# Patient Record
Sex: Male | Born: 1995 | Race: White | Hispanic: No | Marital: Single | Smoking: Current every day smoker
Health system: Southern US, Community
[De-identification: ages and names within clinical notes are randomized; demographics above are authoritative.]

## PROBLEM LIST (undated history)

## (undated) ENCOUNTER — Emergency Department (HOSPITAL_COMMUNITY): Admission: EM | Payer: Self-pay | Source: Home / Self Care

## (undated) ENCOUNTER — Emergency Department: Admission: EM | Source: Home / Self Care

## (undated) DIAGNOSIS — F329 Major depressive disorder, single episode, unspecified: Secondary | ICD-10-CM

## (undated) DIAGNOSIS — M419 Scoliosis, unspecified: Secondary | ICD-10-CM

## (undated) DIAGNOSIS — J45909 Unspecified asthma, uncomplicated: Secondary | ICD-10-CM

## (undated) DIAGNOSIS — F32A Depression, unspecified: Secondary | ICD-10-CM

## (undated) DIAGNOSIS — E079 Disorder of thyroid, unspecified: Secondary | ICD-10-CM

---
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
## 2015-01-27 ENCOUNTER — Encounter (HOSPITAL_COMMUNITY): Payer: Self-pay | Admitting: *Deleted

## 2015-01-27 ENCOUNTER — Emergency Department (HOSPITAL_COMMUNITY)
Admission: EM | Admit: 2015-01-27 | Discharge: 2015-01-27 | Disposition: A | Discharge status: Home / Self Care | Payer: Medicaid Other | Attending: Emergency Medicine | Admitting: Emergency Medicine

## 2015-01-27 DIAGNOSIS — Z8739 Personal history of other diseases of the musculoskeletal system and connective tissue: Secondary | ICD-10-CM | POA: Insufficient documentation

## 2015-01-27 DIAGNOSIS — J45909 Unspecified asthma, uncomplicated: Secondary | ICD-10-CM | POA: Diagnosis not present

## 2015-01-27 DIAGNOSIS — Z720 Tobacco use: Secondary | ICD-10-CM | POA: Diagnosis not present

## 2015-01-27 DIAGNOSIS — R300 Dysuria: Secondary | ICD-10-CM

## 2015-01-27 DIAGNOSIS — Z880 Allergy status to penicillin: Secondary | ICD-10-CM | POA: Diagnosis not present

## 2015-01-27 DIAGNOSIS — Z79899 Other long term (current) drug therapy: Secondary | ICD-10-CM | POA: Insufficient documentation

## 2015-01-27 HISTORY — DX: Unspecified asthma, uncomplicated: J45.909

## 2015-01-27 LAB — URINALYSIS, ROUTINE W REFLEX MICROSCOPIC
Bilirubin Urine: NEGATIVE
Glucose, UA: NEGATIVE mg/dL
Hgb urine dipstick: NEGATIVE
KETONES UR: NEGATIVE mg/dL
Nitrite: NEGATIVE
Protein, ur: NEGATIVE mg/dL
Specific Gravity, Urine: 1.02 (ref 1.005–1.030)
Urobilinogen, UA: 0.2 mg/dL (ref 0.0–1.0)
pH: 6.5 (ref 5.0–8.0)

## 2015-01-27 LAB — URINE MICROSCOPIC-ADD ON

## 2015-01-27 MED ORDER — CEFTRIAXONE SODIUM 250 MG IJ SOLR
250.0000 mg | Freq: Once | INTRAMUSCULAR | Status: AC
  Administered 2015-01-27: 250 mg via INTRAMUSCULAR
  Filled 2015-01-27: qty 250

## 2015-01-27 MED ORDER — CIPROFLOXACIN HCL 500 MG PO TABS: 500.0000 mg | ORAL_TABLET | Freq: Two times a day (BID) | ORAL | Status: DC

## 2015-01-27 MED ORDER — AZITHROMYCIN 250 MG PO TABS
1000.0000 mg | ORAL_TABLET | Freq: Once | ORAL | Status: AC
  Administered 2015-01-27: 1000 mg via ORAL
  Filled 2015-01-27: qty 4

## 2015-01-27 MED ORDER — LIDOCAINE HCL (PF) 1 % IJ SOLN
INTRAMUSCULAR | Status: AC
  Administered 2015-01-27: 2 mL
  Filled 2015-01-27: qty 5

## 2015-01-27 NOTE — ED Notes (Signed)
Dysuria for 1.5 weeks, alert, NAD

## 2015-01-27 NOTE — Discharge Instructions (Signed)
You were treated tonight with Zithromax and Rocephin. Please [facility] for additional testing and for follow-up if the pain with urination is not improving. Please use Cipro daily for the next 5 days. Please take this medication with food.

## 2015-01-27 NOTE — ED Provider Notes (Signed)
CSN: [id number]     Arrival date & time 01/27/15  1910 History   First MD Initiated Contact with Patient 01/27/15 2048     Chief Complaint  Patient presents with  . s74.5      (Consider location/radiation/quality/duration/timing/severity/associated sxs/prior Treatment) Patient is a 19 y.o. male presenting with dysuria. The history is provided by the patient.  Dysuria This is a new problem. The current episode started 1 to 4 weeks ago. The problem occurs intermittently. The problem has been gradually worsening. Associated symptoms include urinary symptoms. Pertinent negatives include no abdominal pain, arthralgias, chills, fever, headaches or vomiting. Exacerbated by: urination. He has tried nothing for the symptoms. The treatment provided no relief.    Past Medical History  Diagnosis Date  . Scoliosis   . Asthma    History reviewed. No pertinent past surgical history. History reviewed. No pertinent family history. History  Substance Use Topics  . Smoking status: Current Every Day Smoker -- 0.25 packs/day    Types: Cigarettes  . Smokeless tobacco: Not on file  . Alcohol Use: Yes     Comment: socially    Review of Systems  Constitutional: Negative for fever and chills.  Gastrointestinal: Negative for vomiting and abdominal pain.  Genitourinary: Positive for dysuria.  Musculoskeletal: Negative for arthralgias.  Neurological: Negative for headaches.  All other systems reviewed and are negative.     Allergies  Penicillins and Sulfa antibiotics  Home Medications   Prior to Admission medications   Medication Sig Start Date End Date Taking? Authorizing Provider  albuterol (PROVENTIL HFA;VENTOLIN HFA) 108 (90 BASE) MCG/ACT inhaler Inhale 2 puffs into the lungs every 6 (six) hours as needed for wheezing or shortness of breath.    Historical Provider, MD   BP 130/67 mmHg  Pulse 84  Temp(Src) 98.7 F (37.1 C) (Oral)  Resp 20  Wt 150 lb (68.04 kg)  SpO2 100% Physical Exam   Constitutional: He is oriented to person, place, and time. He appears well-developed and well-nourished.  Non-toxic appearance.  HENT:  Head: Normocephalic.  Right Ear: Tympanic membrane and external ear normal.  Left Ear: Tympanic membrane and external ear normal.  Eyes: EOM and lids are normal. Pupils are equal, round, and reactive to light.  Neck: Normal range of motion. Neck supple. Carotid bruit is not present.  Cardiovascular: Normal rate, regular rhythm, normal heart sounds, intact distal pulses and normal pulses.   Pulmonary/Chest: Breath sounds normal. No respiratory distress.  Abdominal: Soft. Bowel sounds are normal. There is no tenderness. There is no guarding.  Genitourinary:  There a few palpable nodes of the inguinal area. There is no rash noted of the penis or scrotum. There is no tenderness involving the testicles. There is no swelling of the testicles. There is no evidence for hernia. There is no discharge noted from the urethra at this point.  Musculoskeletal: Normal range of motion.  Lymphadenopathy:       Head (right side): No submandibular adenopathy present.       Head (left side): No submandibular adenopathy present.    He has no cervical adenopathy.  Neurological: He is alert and oriented to person, place, and time. He has normal strength. No cranial nerve deficit or sensory deficit.  Skin: Skin is warm and dry.  Psychiatric: He has a normal mood and affect. His speech is normal.  Nursing note and vitals reviewed.   ED Course  Procedures (including critical care time) Labs Review Labs Reviewed  URINALYSIS, ROUTINE W REFLEX  MICROSCOPIC (NOT AT [facility]) - Abnormal; Notable for the following:    Color, Urine STRAW (*)    Leukocytes, UA TRACE (*)    All other components within normal limits  URINE CULTURE  URINE MICROSCOPIC-ADD ON  GC/CHLAMYDIA PROBE AMP ([place]) NOT AT [facility]    Imaging Review No results found.   EKG Interpretation None       MDM  Vital signs are well within normal limits. The pulse oximetry is 100%. Within normal limits by my interpretation. Urinalysis reveals positive leukocyte esterase. There are 3-6 white blood cells present per high-powered field, there urinalysis is otherwise normal. The patient states that he has dysuria for 1-1/2 weeks, he states he's also noted some whitish discharge on last week. The patient will be treated with Zithromax and Rocephin. Prescription for Cipro will also be given for the urinary infection. Urine culture has been sent to the lab. Discussed safer sex with the patient.    Final diagnoses:  None    **I have reviewed nursing notes, vital signs, and all appropriate lab and imaging results for this patient.Ivery Quale, PA-C 01/27/15 2104  Bethann Berkshire, MD 01/30/15 [phone]

## 2015-01-27 NOTE — ED Notes (Signed)
Pt c/o dysuria x 1.5 weeks and noticed some white discharge last week.

## 2015-01-29 LAB — GC/CHLAMYDIA PROBE AMP (~~LOC~~) NOT AT ARMC
CHLAMYDIA, DNA PROBE: NEGATIVE
Neisseria Gonorrhea: NEGATIVE

## 2015-01-30 LAB — URINE CULTURE: Culture: NO GROWTH

## 2015-03-07 ENCOUNTER — Emergency Department
Admission: EM | Admit: 2015-03-07 | Discharge: 2015-03-07 | Disposition: A | Discharge status: Other Acute Inpatient Hospital | Payer: Medicaid Other | Attending: Emergency Medicine | Admitting: Emergency Medicine

## 2015-03-07 ENCOUNTER — Inpatient Hospital Stay
Admission: AD | Admit: 2015-03-07 | Discharge: 2015-03-13 | DRG: 885 | Disposition: A | Discharge status: Home / Self Care | Payer: Medicaid Other | Source: Ambulatory Visit | Attending: Psychiatry | Admitting: Psychiatry

## 2015-03-07 DIAGNOSIS — Z79899 Other long term (current) drug therapy: Secondary | ICD-10-CM | POA: Diagnosis not present

## 2015-03-07 DIAGNOSIS — J45909 Unspecified asthma, uncomplicated: Secondary | ICD-10-CM | POA: Diagnosis present

## 2015-03-07 DIAGNOSIS — F17210 Nicotine dependence, cigarettes, uncomplicated: Secondary | ICD-10-CM | POA: Diagnosis present

## 2015-03-07 DIAGNOSIS — Z720 Tobacco use: Secondary | ICD-10-CM | POA: Insufficient documentation

## 2015-03-07 DIAGNOSIS — R440 Auditory hallucinations: Secondary | ICD-10-CM | POA: Diagnosis present

## 2015-03-07 DIAGNOSIS — M549 Dorsalgia, unspecified: Secondary | ICD-10-CM | POA: Diagnosis present

## 2015-03-07 DIAGNOSIS — R45851 Suicidal ideations: Secondary | ICD-10-CM | POA: Diagnosis present

## 2015-03-07 DIAGNOSIS — Z046 Encounter for general psychiatric examination, requested by authority: Secondary | ICD-10-CM | POA: Insufficient documentation

## 2015-03-07 DIAGNOSIS — F1690 Hallucinogen use, unspecified, uncomplicated: Secondary | ICD-10-CM | POA: Diagnosis present

## 2015-03-07 DIAGNOSIS — Z880 Allergy status to penicillin: Secondary | ICD-10-CM | POA: Diagnosis not present

## 2015-03-07 DIAGNOSIS — F1420 Cocaine dependence, uncomplicated: Secondary | ICD-10-CM | POA: Diagnosis not present

## 2015-03-07 DIAGNOSIS — K5900 Constipation, unspecified: Secondary | ICD-10-CM | POA: Diagnosis present

## 2015-03-07 DIAGNOSIS — E039 Hypothyroidism, unspecified: Secondary | ICD-10-CM | POA: Diagnosis present

## 2015-03-07 DIAGNOSIS — F332 Major depressive disorder, recurrent severe without psychotic features: Secondary | ICD-10-CM | POA: Diagnosis present

## 2015-03-07 DIAGNOSIS — F1290 Cannabis use, unspecified, uncomplicated: Secondary | ICD-10-CM | POA: Diagnosis present

## 2015-03-07 DIAGNOSIS — F1220 Cannabis dependence, uncomplicated: Secondary | ICD-10-CM | POA: Diagnosis present

## 2015-03-07 DIAGNOSIS — F315 Bipolar disorder, current episode depressed, severe, with psychotic features: Secondary | ICD-10-CM | POA: Diagnosis present

## 2015-03-07 DIAGNOSIS — F329 Major depressive disorder, single episode, unspecified: Secondary | ICD-10-CM | POA: Diagnosis present

## 2015-03-07 DIAGNOSIS — Z7951 Long term (current) use of inhaled steroids: Secondary | ICD-10-CM

## 2015-03-07 DIAGNOSIS — E079 Disorder of thyroid, unspecified: Secondary | ICD-10-CM | POA: Diagnosis present

## 2015-03-07 DIAGNOSIS — F17200 Nicotine dependence, unspecified, uncomplicated: Secondary | ICD-10-CM | POA: Diagnosis present

## 2015-03-07 DIAGNOSIS — F32A Depression, unspecified: Secondary | ICD-10-CM

## 2015-03-07 DIAGNOSIS — F1410 Cocaine abuse, uncomplicated: Secondary | ICD-10-CM | POA: Diagnosis present

## 2015-03-07 DIAGNOSIS — M419 Scoliosis, unspecified: Secondary | ICD-10-CM | POA: Diagnosis present

## 2015-03-07 DIAGNOSIS — F1010 Alcohol abuse, uncomplicated: Secondary | ICD-10-CM | POA: Diagnosis present

## 2015-03-07 DIAGNOSIS — G4700 Insomnia, unspecified: Secondary | ICD-10-CM | POA: Diagnosis present

## 2015-03-07 DIAGNOSIS — F419 Anxiety disorder, unspecified: Secondary | ICD-10-CM | POA: Diagnosis present

## 2015-03-07 DIAGNOSIS — Z792 Long term (current) use of antibiotics: Secondary | ICD-10-CM | POA: Diagnosis not present

## 2015-03-07 DIAGNOSIS — F314 Bipolar disorder, current episode depressed, severe, without psychotic features: Secondary | ICD-10-CM | POA: Diagnosis present

## 2015-03-07 HISTORY — DX: Major depressive disorder, single episode, unspecified: F32.9

## 2015-03-07 HISTORY — DX: Disorder of thyroid, unspecified: E07.9

## 2015-03-07 HISTORY — DX: Depression, unspecified: F32.A

## 2015-03-07 LAB — COMPREHENSIVE METABOLIC PANEL
ALK PHOS: 49 U/L (ref 38–126)
ALT: 15 U/L — ABNORMAL LOW (ref 17–63)
AST: 22 U/L (ref 15–41)
Albumin: 4.9 g/dL (ref 3.5–5.0)
Anion gap: 9 (ref 5–15)
BUN: 14 mg/dL (ref 6–20)
CALCIUM: 9.7 mg/dL (ref 8.9–10.3)
CO2: 27 mmol/L (ref 22–32)
Chloride: 104 mmol/L (ref 101–111)
Creatinine, Ser: 1 mg/dL (ref 0.61–1.24)
GLUCOSE: 77 mg/dL (ref 65–99)
POTASSIUM: 3.7 mmol/L (ref 3.5–5.1)
Sodium: 140 mmol/L (ref 135–145)
Total Bilirubin: 0.7 mg/dL (ref 0.3–1.2)
Total Protein: 8.2 g/dL — ABNORMAL HIGH (ref 6.5–8.1)

## 2015-03-07 LAB — ETHANOL

## 2015-03-07 LAB — URINE DRUG SCREEN, QUALITATIVE (ARMC ONLY)
AMPHETAMINES, UR SCREEN: NOT DETECTED
Barbiturates, Ur Screen: NOT DETECTED
Benzodiazepine, Ur Scrn: NOT DETECTED
CANNABINOID 50 NG, UR ~~LOC~~: POSITIVE — AB
COCAINE METABOLITE, UR ~~LOC~~: POSITIVE — AB
MDMA (ECSTASY) UR SCREEN: NOT DETECTED
Methadone Scn, Ur: NOT DETECTED
Opiate, Ur Screen: NOT DETECTED
Phencyclidine (PCP) Ur S: NOT DETECTED
Tricyclic, Ur Screen: NOT DETECTED

## 2015-03-07 LAB — CBC
HCT: 42.4 % (ref 40.0–52.0)
Hemoglobin: 14.2 g/dL (ref 13.0–18.0)
MCH: 30 pg (ref 26.0–34.0)
MCHC: 33.6 g/dL (ref 32.0–36.0)
MCV: 89.3 fL (ref 80.0–100.0)
Platelets: 210 10*3/uL (ref 150–440)
RBC: 4.75 MIL/uL (ref 4.40–5.90)
RDW: 12.7 % (ref 11.5–14.5)
WBC: 8 10*3/uL (ref 3.8–10.6)

## 2015-03-07 LAB — SALICYLATE LEVEL: Salicylate Lvl: <4 mg/dL (ref 2.8–30.0)

## 2015-03-07 LAB — ACETAMINOPHEN LEVEL: Acetaminophen (Tylenol), Serum: <10 ug/mL — ABNORMAL LOW (ref 10–30)

## 2015-03-07 MED ORDER — TRAZODONE HCL 50 MG PO TABS
50.0000 mg | ORAL_TABLET | Freq: Once | ORAL | Status: AC
  Administered 2015-03-08: 50 mg via ORAL
  Filled 2015-03-07: qty 1

## 2015-03-07 MED ORDER — ALUM & MAG HYDROXIDE-SIMETH 200-200-20 MG/5ML PO SUSP: 30.0000 mL | ORAL | Status: DC | PRN

## 2015-03-07 MED ORDER — ACETAMINOPHEN 325 MG PO TABS
650.0000 mg | ORAL_TABLET | Freq: Four times a day (QID) | ORAL | Status: DC | PRN
  Administered 2015-03-08 – 2015-03-11 (×4): 650 mg via ORAL
  Filled 2015-03-07 (×5): qty 2

## 2015-03-07 MED ORDER — MAGNESIUM HYDROXIDE 400 MG/5ML PO SUSP
30.0000 mL | Freq: Every day | ORAL | Status: DC | PRN
  Administered 2015-03-08 – 2015-03-11 (×2): 30 mL via ORAL
  Filled 2015-03-07 (×2): qty 30

## 2015-03-07 MED ORDER — NICOTINE 10 MG IN INHA
1.0000 | RESPIRATORY_TRACT | Status: DC | PRN
  Administered 2015-03-08 – 2015-03-11 (×3): 1 via RESPIRATORY_TRACT
  Filled 2015-03-07 (×4): qty 36

## 2015-03-07 MED ORDER — CITALOPRAM HYDROBROMIDE 20 MG PO TABS
20.0000 mg | ORAL_TABLET | Freq: Every day | ORAL | Status: DC
  Administered 2015-03-07: 20 mg via ORAL
  Filled 2015-03-07: qty 1

## 2015-03-07 MED ORDER — HYDROXYZINE HCL 25 MG PO TABS
25.0000 mg | ORAL_TABLET | Freq: Once | ORAL | Status: AC
  Administered 2015-03-08: 25 mg via ORAL
  Filled 2015-03-07: qty 1

## 2015-03-07 MED ORDER — HYDROXYZINE HCL 25 MG PO TABS: 25.0000 mg | ORAL_TABLET | Freq: Three times a day (TID) | ORAL | Status: DC | PRN

## 2015-03-07 NOTE — ED Notes (Signed)
ED BHU PLACEMENT JUSTIFICATION Is the patient under IVC or is there intent for IVC: No. Is the patient medically cleared: No. Is there vacancy in the ED BHU: Yes.   Is the population mix appropriate for patient: Yes.   Is the patient awaiting placement in inpatient or outpatient setting: No. Has the patient had a psychiatric consult: No. Survey of unit performed for contraband, proper placement and condition of furniture, tampering with fixtures in bathroom, shower, and each patient room: Yes.  ; Findings:  APPEARANCE/BEHAVIOR calm and cooperative NEURO ASSESSMENT Orientation: time, place and person Hallucinations: No.None noted (Hallucinations) Speech: Normal Gait: normal RESPIRATORY ASSESSMENT Normal expansion.  Clear to auscultation.  No rales, rhonchi, or wheezing. CARDIOVASCULAR ASSESSMENT regular rate and rhythm, S1, S2 normal, no murmur, click, rub or gallop GASTROINTESTINAL ASSESSMENT soft, nontender, BS WNL, no r/g EXTREMITIES normal strength, tone, and muscle mass PLAN OF CARE Provide calm/safe environment. Vital signs assessed twice daily. ED BHU Assessment once each 12-hour shift. Collaborate with intake RN daily or as condition indicates. Assure the ED provider has rounded once each shift. Provide and encourage hygiene. Provide redirection as needed. Assess for escalating behavior; address immediately and inform ED provider.  Assess family dynamic and appropriateness for visitation as needed: Yes.  ; If necessary, describe findings:  Educate the patient/family about BHU procedures/visitation: Yes.  ; If necessary, describe findings:

## 2015-03-07 NOTE — ED Notes (Signed)

## 2015-03-07 NOTE — BH Assessment (Signed)
Assessment Note  Ethan Harvey is an 19 y.o. male who presents to the ER, via his girl friend, due to an increase of his symptoms of depression.  He also reports of having increase of Suicidal Ideation. He states the last two weeks has been the worse. He states the thoughts have gotten to the point, they are scarring him. He states the things that have worked in the past to deal with his depression isn't working. He admits to an increase of drug use in order to cope through the depression. Drugs he is currently using are; THC, Cocaine, Mushrooms & Pain Pills. Per his report, "when I'm high, I don't feel it (depression)."  Patient states, he has been depressed since middle school. His peers made fun of him and did thing to embarrassed him. He has a history of cutting but hasn't cut himself since [employer]. This week, he was having thoughts about cutting again but hasn't. He states, "every time I look at a knife, I think about cutting my arm all the way open but I don't want to do it..." He further reports, "I hide the knives from myself so I won't think about it(SI)..." Patient uses a box cutter at his job and he reports of putting it up, "I don't want to look at it and the thoughts keep coming...."  He reports of having no history of mental health and substance treatment. He states he wanted to get help, because he is afraid of what he may do. Thus, this week he drove to [facility] as means to get help. However, they told him he needed to get a referral in order to be admitted. He states, "I heard people talk about going there if they have mental problems. So I went there..."  During the assessment, the patient was tearful and at times he would stop talking due to crying.  Patient states, he doesn't like himself and the thoughts of dying are "crazy to him." Per his report, up to this point he's been able to be rationale about "having no reasoning to want to die" but now is unable to  regulate his moods and his emotions. "I never liked myself but I always been able to shake these feelings (depression) but I can't shake this time."  Symptoms of depression he reports of having, are; increase crying spells, increase of isolating himself, no desire to do things he use to enjoy, feelings of hopelessness, helplessness, worthlessness and wanting to sit in a room with the lights off.   He denies HI and AV/H. Also denies involvement with the legal system and no history of aggression.  Axis I: Major Depression, Recurrent severe Axis III:  Past Medical History  Diagnosis Date  . Scoliosis   . Asthma    Axis IV: economic problems, housing problems, other psychosocial or environmental problems, problems related to social environment, problems with access to health care services and problems with primary support group  Past Medical History:  Past Medical History  Diagnosis Date  . Scoliosis   . Asthma     History reviewed. No pertinent past surgical history.  Family History: No family history on file.  Social History:  reports that he has been smoking Cigarettes.  He has been smoking about 0.25 packs per day. He does not have any smokeless tobacco history on file. He reports that he drinks alcohol. He reports that he uses illicit drugs (Cocaine and Marijuana).  Additional Social History:  Alcohol /  Drug Use Pain Medications: None Reported Prescriptions: None Reported Over the Counter: None Reported History of alcohol / drug use?: Yes Longest period of sobriety (when/how long): Several Days Negative Consequences of Use:  (Induced Mood D/O) Substance #1 Name of Substance 1: THC 1 - Age of First Use: Middle School Age 53 - Amount (size/oz): "What ever I can get may hands on." 1 - Frequency: Vaires from week to week." 1 - Duration: Several years. 1 - Last Use / Amount: 03/06/2015 Substance #2 Name of Substance 2: Cocaine 2 - Age of First Use: Middle School Age 64 - Amount  (size/oz): Unknown 2 - Frequency: "Vaires from week to week." 2 - Duration: Several years. 2 - Last Use / Amount: 03/06/2015 Substance #3 Name of Substance 3: Pain Pills 3 - Age of First Use: Middle School Age 89 - Amount (size/oz): Unknown 3 - Frequency: "Vaires from week to week." 3 - Duration: Several years 3 - Last Use / Amount: 01/2015 Substance #4 Name of Substance 4: Mushrooms 4 - Age of First Use: High School 4 - Amount (size/oz): Unknown 4 - Frequency: Few times in his life time 4 - Duration: Few times in his life tiem 4 - Last Use / Amount: 2015  CIWA: CIWA-Ar BP: 130/85 mmHg Pulse Rate: (!) 101 COWS:    Allergies:  Allergies  Allergen Reactions  . Penicillins Other (See Comments)    Generalized "blisters"  . Sulfa Antibiotics Other (See Comments)    "blisters" in mouth.    Home Medications:  (Not in a hospital admission)  OB/GYN Status:  No LMP for male patient.  General Assessment Data Location of Assessment: [facility] ED TTS Assessment: In system Is this a Tele or Face-to-Face Assessment?: Face-to-Face Is this an Initial Assessment or a Re-assessment for this encounter?: Initial Assessment Marital status: Single Maiden name: n/a Is patient pregnant?: No Living Arrangements: Parent, Other relatives Can pt return to current living arrangement?: Yes Admission Status: Voluntary Is patient capable of signing voluntary admission?: Yes Referral Source: Self/Family/Friend Insurance type: Medicaid  Medical Screening Exam [facility] Walk-in ONLY) Medical Exam completed: Yes  Crisis Care Plan Living Arrangements: Parent, Other relatives Name of Psychiatrist: n/a Name of Therapist: n/a  Education Status Is patient currently in school?: No Current Grade: n/a Highest grade of school patient has completed: 12th Name of school: n/a Contact person: n/a  Risk to self with the past 6 months Suicidal Ideation: Yes-Currently Present Has patient been a risk to self  within the past 6 months prior to admission? : Other (comment) (Having an increase of SI but don't won't to act on them.) Suicidal Intent: No Has patient had any suicidal intent within the past 6 months prior to admission? : Other (comment) Is patient at risk for suicide?: Yes Suicidal Plan?: Yes-Currently Present Has patient had any suicidal plan within the past 6 months prior to admission? : Other (comment) Specify Current Suicidal Plan: Several thoughts of SI but no specific plan. Access to Means: Yes Specify Access to Suicidal Means: Knives What has been your use of drugs/alcohol within the last 12 months?: THC, Cocaine, Mushrooms & Pain Pills Previous Attempts/Gestures: No How many times?: 0 Other Self Harm Risks: History of cutting Triggers for Past Attempts: None known Intentional Self Injurious Behavior: Cutting Comment - Self Injurious Behavior: Cutting Family Suicide History: Unknown Recent stressful life event(s): Other (Comment) (Unknown. History of depression.) Persecutory voices/beliefs?: No Depression: Yes Depression Symptoms: Feeling worthless/self pity, Feeling angry/irritable, Loss of interest in  usual pleasures, Fatigue, Isolating, Tearfulness, Guilt Substance abuse history and/or treatment for substance abuse?: Yes Suicide prevention information given to non-admitted patients: Not applicable  Risk to Others within the past 6 months Homicidal Ideation: No Does patient have any lifetime risk of violence toward others beyond the six months prior to admission? : No Thoughts of Harm to Others: No Current Homicidal Intent: No Current Homicidal Plan: No Access to Homicidal Means: No Identified Victim: None Reported History of harm to others?: No Assessment of Violence: None Noted Violent Behavior Description: None Reported Does patient have access to weapons?: No Criminal Charges Pending?: No Does patient have a court date: No Is patient on probation?:  No  Psychosis Hallucinations: None noted Delusions: None noted  Mental Status Report Appearance/Hygiene: In hospital gown, In scrubs, Unremarkable Eye Contact: Good Motor Activity: Freedom of movement, Unremarkable Speech: Logical/coherent, Soft Level of Consciousness: Alert Mood: Depressed, Sad, Pleasant, Worthless, low self-esteem, Empty, Despair, Ashamed/humiliated Affect: Anxious, Depressed, Sad Anxiety Level: Minimal Thought Processes: Coherent, Relevant Judgement: Unimpaired Orientation: Person, Place, Time, Situation, Appropriate for developmental age Obsessive Compulsive Thoughts/Behaviors: Severe  Cognitive Functioning Concentration: Decreased Memory: Recent Intact, Remote Intact IQ: Average Insight: Fair Impulse Control: Fair Appetite: Poor Weight Loss: 0 Weight Gain: 0 Sleep: No Change Total Hours of Sleep: 8 Vegetative Symptoms: None  ADLScreening [facility] Assessment Services) Patient's cognitive ability adequate to safely complete daily activities?: Yes Patient able to express need for assistance with ADLs?: Yes Independently performs ADLs?: Yes (appropriate for developmental age)  Prior Inpatient Therapy Prior Inpatient Therapy: No Prior Therapy Dates: n/a Prior Therapy Facilty/Provider(s): n/a Reason for Treatment: n/a  Prior Outpatient Therapy Prior Outpatient Therapy: No Prior Therapy Dates: n/a Prior Therapy Facilty/Provider(s): n/a Reason for Treatment: n/a Does patient have an ACCT team?: No Does patient have Intensive In-House Services?  : No Does patient have [facility] services? : No Does patient have P4CC services?: No  ADL Screening (condition at time of admission) Patient's cognitive ability adequate to safely complete daily activities?: Yes Patient able to express need for assistance with ADLs?: Yes Independently performs ADLs?: Yes (appropriate for developmental age)       Abuse/Neglect Assessment (Assessment to be complete while  patient is alone) Physical Abuse: Denies Verbal Abuse: Denies Sexual Abuse: Denies Exploitation of patient/patient's resources: Denies Self-Neglect: Denies Values / Beliefs Cultural Requests During Hospitalization: None Spiritual Requests During Hospitalization: None Consults Spiritual Care Consult Needed: No Social Work Consult Needed: No Merchant navy officer (For Healthcare) Does patient have an advance directive?: No Would patient like information on creating an advanced directive?: Yes English as a second language teacher given    Additional Information 1:1 In Past 12 Months?: No CIRT Risk: No Elopement Risk: No Does patient have medical clearance?: Yes  Child/Adolescent Assessment Running Away Risk: Denies (Patient is an adult)  Disposition:  Disposition Initial Assessment Completed for this Encounter: Yes Disposition of Patient: Other dispositions (Patient is to be seen by Psych MD) Other disposition(s): Other (Comment) (Patient is to be seen by Psych MD)  On Site Evaluation by:   Reviewed with Physician:    Lilyan Gilford, MS, LCAS, LPC, NCC, CCSI 03/07/2015 5:01 PM

## 2015-03-07 NOTE — ED Notes (Signed)
BEHAVIORAL HEALTH ROUNDING Patient sleeping: No. Patient alert and oriented: yes Behavior appropriate: Yes.  ; If no, describe:  Nutrition and fluids offered: No Toileting and hygiene offered: No Sitter present: not applicable Law enforcement present: Yes  

## 2015-03-07 NOTE — Consult Note (Signed)
[place] [facility] Consult   Reason for Consult:  Follow up Referring Physician:  Er Patient Identification: Ethan Harvey MRN:  [id number] Principal Diagnosis: <principal problem not specified> Diagnosis:  There are no active problems to display for this patient.   Total Time spent with patient: 1 hour  Subjective:   Ethan Harvey is a 19 y.o. male patient admitted with a long H/O depression.with SI and thoughts of hurting self.  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 Pt is single and lives with his mother who is in her 43's and works with [employer] Pt has an older brother who is in his 87's and stays with them but does not live and is not employed and is a drug abuser and uses pt's money to get drugs and got him into cocaine and THC along with maternal aunt. Mother reports that older brother runs away from Law because of drugs. Pt has been a good kid but is afraid of the older brother and needs help with the same. HPI Elements:     Past Medical History:  Past Medical History  Diagnosis Date  . Scoliosis   . Asthma   . Thyroid disease Per mother   History reviewed. No pertinent past surgical history. Family History: History reviewed. No pertinent family history. Social History:  History  Alcohol Use  . Yes    Comment: socially     History  Drug Use  . Yes  . Special: Cocaine, Marijuana    History   Social History  . Marital Status: Single    Spouse Name: N/A  . Number of Children: N/A  . Years of Education: N/A   Social History Main Topics  . Smoking status: Current Every Day Smoker -- 0.25 packs/day    Types: Cigarettes  . Smokeless tobacco: Not on file  . Alcohol Use: Yes     Comment: socially  . Drug Use: Yes    Special: Cocaine, Marijuana  . Sexual Activity: Yes   Other Topics Concern  . None   Social History Narrative   Additional Social History:    Pain Medications: None Reported Prescriptions: None  Reported Over the Counter: None Reported History of alcohol / drug use?: Yes Longest period of sobriety (when/how long): Several Days Negative Consequences of Use:  (Induced Mood D/O) Name of Substance 1: THC 1 - Age of First Use: Middle School Age 89 - Amount (size/oz): "What ever I can get may hands on." 1 - Frequency: Vaires from week to week." 1 - Duration: Several years. 1 - Last Use / Amount: 03/06/2015 Name of Substance 2: Cocaine 2 - Age of First Use: Middle School Age 64 - Amount (size/oz): Unknown 2 - Frequency: "Vaires from week to week." 2 - Duration: Several years. 2 - Last Use / Amount: 03/06/2015 Name of Substance 3: Pain Pills 3 - Age of First Use: Middle School Age 58 - Amount (size/oz): Unknown 3 - Frequency: "Vaires from week to week." 3 - Duration: Several years 3 - Last Use / Amount: 01/2015 Name of Substance 4: Mushrooms 4 - Age of First Use: High School 4 - Amount (size/oz): Unknown 4 - Frequency: Few times in his life time 4 - Duration: Few times in his life tiem 4 - Last Use / Amount: 2015             Allergies:   Allergies  Allergen Reactions  . Penicillins  Other (See Comments)    Generalized "blisters"  . Sulfa Antibiotics Other (See Comments)    "blisters" in mouth.    Labs:  Results for orders placed or performed during the hospital encounter of 03/07/15 (from the past 48 hour(s))  Comprehensive metabolic panel     Status: Abnormal   Collection Time: 03/07/15  2:36 PM  Result Value Ref Range   Sodium 140 135 - 145 mmol/L   Potassium 3.7 3.5 - 5.1 mmol/L   Chloride 104 101 - 111 mmol/L   CO2 27 22 - 32 mmol/L   Glucose, Bld 77 65 - 99 mg/dL   BUN 14 6 - 20 mg/dL   Creatinine, Ser 1.00 0.61 - 1.24 mg/dL   Calcium 9.7 8.9 - 10.3 mg/dL   Total Protein 8.2 (H) 6.5 - 8.1 g/dL   Albumin 4.9 3.5 - 5.0 g/dL   AST 22 15 - 41 U/L   ALT 15 (L) 17 - 63 U/L   Alkaline Phosphatase 49 38 - 126 U/L   Total Bilirubin 0.7 0.3 - 1.2 mg/dL   GFR  calc non Af Amer >60 >60 mL/min   GFR calc Af Amer >60 >60 mL/min    Comment: (NOTE) The eGFR has been calculated using the CKD EPI equation. This calculation has not been validated in all clinical situations. eGFR's persistently <60 mL/min signify possible Chronic Kidney Disease.    Anion gap 9 5 - 15  Ethanol (ETOH)     Status: None   Collection Time: 03/07/15  2:36 PM  Result Value Ref Range   Alcohol, Ethyl (B) <5 <5 mg/dL    Comment:        LOWEST DETECTABLE LIMIT FOR SERUM ALCOHOL IS 5 mg/dL FOR MEDICAL PURPOSES ONLY   Salicylate level     Status: None   Collection Time: 03/07/15  2:36 PM  Result Value Ref Range   Salicylate Lvl <9.8 2.8 - 30.0 mg/dL  Acetaminophen level     Status: Abnormal   Collection Time: 03/07/15  2:36 PM  Result Value Ref Range   Acetaminophen (Tylenol), Serum <10 (L) 10 - 30 ug/mL    Comment:        THERAPEUTIC CONCENTRATIONS VARY SIGNIFICANTLY. A RANGE OF 10-30 ug/mL MAY BE AN EFFECTIVE CONCENTRATION FOR MANY PATIENTS. HOWEVER, SOME ARE BEST TREATED AT CONCENTRATIONS OUTSIDE THIS RANGE. ACETAMINOPHEN CONCENTRATIONS >150 ug/mL AT 4 HOURS AFTER INGESTION AND >50 ug/mL AT 12 HOURS AFTER INGESTION ARE OFTEN ASSOCIATED WITH TOXIC REACTIONS.   CBC     Status: None   Collection Time: 03/07/15  2:36 PM  Result Value Ref Range   WBC 8.0 3.8 - 10.6 K/uL   RBC 4.75 4.40 - 5.90 MIL/uL   Hemoglobin 14.2 13.0 - 18.0 g/dL   HCT 42.4 40.0 - 52.0 %   MCV 89.3 80.0 - 100.0 fL   MCH 30.0 26.0 - 34.0 pg   MCHC 33.6 32.0 - 36.0 g/dL   RDW 12.7 11.5 - 14.5 %   Platelets 210 150 - 440 K/uL  Urine Drug Screen, Qualitative ([facility] only)     Status: Abnormal   Collection Time: 03/07/15  2:36 PM  Result Value Ref Range   Tricyclic, Ur Screen NONE DETECTED NONE DETECTED   Amphetamines, Ur Screen NONE DETECTED NONE DETECTED   MDMA (Ecstasy)Ur Screen NONE DETECTED NONE DETECTED   Cocaine Metabolite,Ur  POSITIVE (A) NONE DETECTED   Opiate, Ur Screen NONE  DETECTED NONE DETECTED   Phencyclidine (PCP) Ur S  NONE DETECTED NONE DETECTED   Cannabinoid 50 Ng, Ur [place] POSITIVE (A) NONE DETECTED   Barbiturates, Ur Screen NONE DETECTED NONE DETECTED   Benzodiazepine, Ur Scrn NONE DETECTED NONE DETECTED   Methadone Scn, Ur NONE DETECTED NONE DETECTED    Comment: (NOTE) 742  Tricyclics, urine               Cutoff 1000 ng/mL 200  Amphetamines, urine             Cutoff 1000 ng/mL 300  MDMA (Ecstasy), urine           Cutoff 500 ng/mL 400  Cocaine Metabolite, urine       Cutoff 300 ng/mL 500  Opiate, urine                   Cutoff 300 ng/mL 600  Phencyclidine (PCP), urine      Cutoff 25 ng/mL 700  Cannabinoid, urine              Cutoff 50 ng/mL 800  Barbiturates, urine             Cutoff 200 ng/mL 900  Benzodiazepine, urine           Cutoff 200 ng/mL 1000 Methadone, urine                Cutoff 300 ng/mL 1100 1200 The urine drug screen provides only a preliminary, unconfirmed 1300 analytical test result and should not be used for non-medical 1400 purposes. Clinical consideration and professional judgment should 1500 be applied to any positive drug screen result due to possible 1600 interfering substances. A more specific alternate chemical method 1700 must be used in order to obtain a confirmed analytical result.  1800 Gas chromato graphy / mass spectrometry (GC/MS) is the preferred 1900 confirmatory method.     Vitals: Blood pressure 130/85, pulse 101, temperature 98 F (36.7 C), temperature source Oral, resp. rate 17, height _0  (1.702 m), weight 72.576 kg (160 lb), SpO2 100 %.  Risk to Self: Suicidal Ideation: Yes-Currently Present Suicidal Intent: No Is patient at risk for suicide?: Yes Suicidal Plan?: Yes-Currently Present Specify Current Suicidal Plan: Several thoughts of SI but no specific plan. Access to Means: Yes Specify Access to Suicidal Means: Knives What has been your use of drugs/alcohol within the last 12 months?: THC, Cocaine,  Mushrooms & Pain Pills How many times?: 0 Other Self Harm Risks: History of cutting Triggers for Past Attempts: None known Intentional Self Injurious Behavior: Cutting Comment - Self Injurious Behavior: Cutting Risk to Others: Homicidal Ideation: No Thoughts of Harm to Others: No Current Homicidal Intent: No Current Homicidal Plan: No Access to Homicidal Means: No Identified Victim: None Reported History of harm to others?: No Assessment of Violence: None Noted Violent Behavior Description: None Reported Does patient have access to weapons?: No Criminal Charges Pending?: No Does patient have a court date: No Prior Inpatient Therapy: Prior Inpatient Therapy: No Prior Therapy Dates: n/a Prior Therapy Facilty/Provider(s): n/a Reason for Treatment: n/a Prior Outpatient Therapy: Prior Outpatient Therapy: No Prior Therapy Dates: n/a Prior Therapy Facilty/Provider(s): n/a Reason for Treatment: n/a Does patient have an ACCT team?: No Does patient have Intensive In-House Services?  : No Does patient have [facility] services? : No Does patient have P4CC services?: No  No current facility-administered medications for this encounter.   Current Outpatient Prescriptions  Medication Sig Dispense Refill  . albuterol (PROVENTIL HFA;VENTOLIN HFA) 108 (90 BASE) MCG/ACT inhaler Inhale 2 puffs into  the lungs every 6 (six) hours as needed for wheezing or shortness of breath.    . ciprofloxacin (CIPRO) 500 MG tablet Take 1 tablet (500 mg total) by mouth 2 (two) times daily. 10 tablet 0    Musculoskeletal: Strength & Muscle Tone: within normal limits Gait & Station: normal Patient leans: N/A  Psychiatric Specialty Exam: Physical Exam  Nursing note and vitals reviewed.   ROS  Blood pressure 130/85, pulse 101, temperature 98 F (36.7 C), temperature source Oral, resp. rate 17, height _0  (1.702 m), weight 72.576 kg (160 lb), SpO2 100 %.Body mass index is 25.05 kg/(m^2).  General Appearance:  Casual  Eye Contact::  Fair  Speech:  Clear and Coherent  Volume:  Normal  Mood:  Anxious, Depressed, Dysphoric, Hopeless and low and down and worried  Affect:  Constricted  Thought Process:  Circumstantial  Orientation:  Full (Time, Place, and Person)  Thought Content:  Paranoid Ideation  Suicidal Thoughts:  Yes.  without intent/plan"Contracts for safety"  Homicidal Thoughts:  No  Memory:  Immediate;   Fair Recent;   Fair Remote;   Fair adequate  Judgement:  Fair  Insight:  Fair  Psychomotor Activity:  Normal  Concentration:  Fair  Recall:  [employer] [place]  Language: Fair  Akathisia:  No  Handed:  Right  AIMS (if indicated):     Assets:  Communication Skills Housing Physical Health Social Support Transportation Others:  and wants to get help and quit using drugs.  ADL's:  Intact  Cognition: WNL  Sleep:      Medical Decision Making: Review of Psycho-Social Stressors (1), Established Problem, Worsening (2) and Review or Order of Psychological Test(s) (1)  Treatment Plan Summary: Plan Start pt on anti-depressent meds and admit when bed is available and pt is medically cleared and stable  Plan:  Recommend psychiatric Inpatient admission when medically cleared. Disposition:as above  Omara Alcon K 03/07/2015 5:30 PM

## 2015-03-07 NOTE — BH Assessment (Signed)
Pt. is to be admitted to [facility] by Dr. Guss Bunde. Attending Physician will be Dr. Jennet Maduro.  Pt. has been assigned to room 322, by [facility] Charge Nurse Amy.  Intake Paper Work has been signed and placed on pt. chart. ER staff Christen Bame, ER Sect.; Dr. Carollee Massed, ER MD; Huntley Dec Patient's Nurse & Nedra Hai Patient Access) have been made aware of the admission.

## 2015-03-07 NOTE — ED Notes (Addendum)
Pt states he was dropped off by his girlfriend, states he lives with his mother in [place]..states he has had problems with depression since middle school and [place] to cut himself to relieve stress, states he has never had treatment for depression and has been struggling more with the depression, states he doesn't want to cut himself and wants help..pt states intermittently that he sees shadows that he knows are not really, denies auditory hallucinations

## 2015-03-07 NOTE — ED Notes (Signed)
Pt's mother is currently visiting.  Pt had arrived around 1500 so mother was permitted a visit.  Mother did not know anything about pt's condition or why he had come to the hospital.

## 2015-03-07 NOTE — ED Notes (Signed)
BEHAVIORAL HEALTH ROUNDING Patient sleeping: No. Patient alert and oriented: yes Behavior appropriate: Yes.  ; If no, describe:  Nutrition and fluids offered: Yes  Toileting and hygiene offered: Yes  Sitter present: not applicable Law enforcement present: Yes  

## 2015-03-07 NOTE — ED Notes (Signed)
Pt stated that he had been a cutter and he did want to hurt himself but did not want to commit suicide.

## 2015-03-07 NOTE — ED Notes (Signed)
Intake team currently with pt.

## 2015-03-07 NOTE — ED Notes (Signed)
Pt's mother spoke with RN and provided the following information.  According to mother, she has two sons: one is good (pt) and one is bad (a 19 yr old).  Mother's sister (pt's aunt) and bad son did not work.  They intimidated the pt, who does work, to use his paycheck to buy drugs and they all used.  The mother is a home health aid and her pt died last 12-21-2022 so she will be getting a new assignment next week; however, for some time past her work schedule has been from 0800 Friday until 0800 Monday.  The mother has worked for years and the bad son has been responsible for the good son.  (The pt has stated that he has been depressed since middle school.  It was about the time of middle school that the bad son started being responsible for the good son.)  Mother said that she had not known until recently how the bad son had bullied the good son.  The pt (good son) made A's and B's in school and never was any discipline problem for the mother.  The good son has a thyroid problem and had stopped taking his medication and he had heart problems and simply cannot take the drugs or the intimidation.  The mother wants the son to be kept here so that he can get the help he needs.  She says that the bad son cannot live with them anymore and she intends to call the police because he uses drugs and he owes back child support and there is some other charge.    RN introduced mother to MD and mother asked RN to help her tell the MD the above information.  MD saw pt.  Certain things pt confirmed and some not.  Pt said that he had seen his doctor about thyroid and she said the dose was so small that he really didn't need to take it any more - so pt quit taking it.  Pt admits to alcohol use whenever he can get it and, when he can get it, he gets drunk.  He admits to one pack cigarettes/day, cocaine and marijuana use.  He admits to using his paycheck to purchase drugs not only for himself but for brother and aunt because they don't  work.  They know where and how to get the drugs, the pt pays for them, and they all use the purchase.  Pt admits to depression since middle school and admits to wanting to hurt himeself, but denies suicidal plans or desire.

## 2015-03-07 NOTE — ED Provider Notes (Signed)
-----------------------------------------   6:19 PM on 03/07/2015 -----------------------------------------  Ethan Harvey has been seen by Dr. Guss Bunde, psychiatry. She will admit him to the hospital for his ongoing depression.   Diagnosis: Major depression  Darien Ramus, MD 03/07/15 [phone]

## 2015-03-07 NOTE — ED Provider Notes (Signed)
[facility] Emergency Department Provider Note  ____________________________________________  Time seen: On arrival  I have reviewed the triage vital signs and the nursing notes.   HISTORY  Chief Complaint Depression and Psychiatric Evaluation    HPI Ethan Harvey is a 19 y.o. male who presents with complaints of depression. He reports a history of self injury in the past but he has not done that" a long time". He reports he is with his girlfriend this weekend but was unable to be happy and he is here because he wants help. He denies suicidal ideation. He denies physical complaints.      Past Medical History  Diagnosis Date  . Scoliosis   . Asthma     There are no active problems to display for this patient.   History reviewed. No pertinent past surgical history.  Current Outpatient Rx  Name  Route  Sig  Dispense  Refill  . albuterol (PROVENTIL HFA;VENTOLIN HFA) 108 (90 BASE) MCG/ACT inhaler   Inhalation   Inhale 2 puffs into the lungs every 6 (six) hours as needed for wheezing or shortness of breath.         . ciprofloxacin (CIPRO) 500 MG tablet   Oral   Take 1 tablet (500 mg total) by mouth 2 (two) times daily.   10 tablet   0     Take with food     Allergies Penicillins and Sulfa antibiotics  No family history on file.  Social History History  Substance Use Topics  . Smoking status: Current Every Day Smoker -- 0.25 packs/day    Types: Cigarettes  . Smokeless tobacco: Not on file  . Alcohol Use: Yes     Comment: socially    Review of Systems  Constitutional: Negative for fever. Eyes: Negative for visual changes. ENT: Negative for sore throat Cardiovascular: Negative for chest pain. Respiratory: Negative for shortness of breath. Gastrointestinal: Negative for abdominal pain, vomiting and diarrhea. Genitourinary: Negative for dysuria. Musculoskeletal: Negative for back pain. Skin: Negative for rash. Neurological:  Negative for headaches or focal weakness Psychiatric: Positive for depression, negative for suicidal ideation  10-point ROS otherwise negative.  ____________________________________________   PHYSICAL EXAM:  VITAL SIGNS: ED Triage Vitals  Enc Vitals Group     BP 03/07/15 1430 130/85 mmHg     Pulse Rate 03/07/15 1430 101     Resp 03/07/15 1430 17     Temp 03/07/15 1430 98 F (36.7 C)     Temp Source 03/07/15 1430 Oral     SpO2 03/07/15 1430 100 %     Weight 03/07/15 1430 160 lb (72.576 kg)     Height 03/07/15 1430 5\' 7"  (1.702 m)     Head Cir --      Peak Flow --      Pain Score --      Pain Loc --      Pain Edu? --      Excl. in GC? --      Constitutional: Alert and oriented. Well appearing and in no distress. Eyes: Conjunctivae are normal.  ENT   Head: Normocephalic and atraumatic.   Mouth/Throat: Mucous membranes are moist. Cardiovascular: Normal rate, regular rhythm. Normal and symmetric distal pulses are present in all extremities. No murmurs, rubs, or gallops. Respiratory: Normal respiratory effort without tachypnea nor retractions. Breath sounds are clear and equal bilaterally.  Gastrointestinal: Soft and non-tender in all quadrants. No distention. There is no CVA tenderness. Genitourinary: deferred Musculoskeletal: Nontender with  normal range of motion in all extremities. No lower extremity tenderness nor edema. Neurologic:  Normal speech and language. No gross focal neurologic deficits are appreciated. Skin:  Skin is warm, dry and intact. No rash noted. Psychiatric: Depressed mood, normal affect ____________________________________________    LABS (pertinent positives/negatives)  Labs Reviewed  CBC  COMPREHENSIVE METABOLIC PANEL  ETHANOL  SALICYLATE LEVEL  ACETAMINOPHEN LEVEL  URINE DRUG SCREEN, QUALITATIVE ([facility] ONLY)    ____________________________________________   EKG  None  ____________________________________________     RADIOLOGY I have personally reviewed any xrays that were ordered on this patient: None ____________________________________________   PROCEDURES  Procedure(s) performed: none  Critical Care performed: none  ____________________________________________   INITIAL IMPRESSION / ASSESSMENT AND PLAN / ED COURSE  Pertinent labs & imaging results that were available during my care of the patient were reviewed by me and considered in my medical decision making (see chart for details).  Patient's history of present illness most consistent with depression. No suicidality. No homicidal intention. No need for involuntary commitment. I will consult TTS and psychiatry to evaluate the patient for medication recommendations   ____________________________________________   FINAL CLINICAL IMPRESSION(S) / ED DIAGNOSES  Final diagnoses:  Depression     Jene Every, MD 03/07/15 1511

## 2015-03-08 ENCOUNTER — Encounter: Payer: Self-pay | Admitting: Psychiatry

## 2015-03-08 MED ORDER — HYDROXYZINE HCL 25 MG PO TABS
25.0000 mg | ORAL_TABLET | Freq: Every day | ORAL | Status: DC
  Administered 2015-03-08 – 2015-03-11 (×4): 25 mg via ORAL
  Filled 2015-03-08 (×4): qty 1

## 2015-03-08 MED ORDER — FOLIC ACID 1 MG PO TABS
1.0000 mg | ORAL_TABLET | Freq: Every day | ORAL | Status: DC
  Administered 2015-03-09 – 2015-03-13 (×5): 1 mg via ORAL
  Filled 2015-03-08 (×6): qty 1

## 2015-03-08 MED ORDER — LORAZEPAM 1 MG PO TABS
1.0000 mg | ORAL_TABLET | Freq: Four times a day (QID) | ORAL | Status: AC | PRN
  Administered 2015-03-08 – 2015-03-10 (×3): 1 mg via ORAL
  Filled 2015-03-08 (×3): qty 1

## 2015-03-08 MED ORDER — THIAMINE HCL 100 MG/ML IJ SOLN
100.0000 mg | Freq: Every day | INTRAMUSCULAR | Status: DC
  Filled 2015-03-08 (×3): qty 1

## 2015-03-08 MED ORDER — LORAZEPAM 2 MG/ML IJ SOLN: 1.0000 mg | Freq: Four times a day (QID) | INTRAMUSCULAR | Status: AC | PRN

## 2015-03-08 MED ORDER — TRAZODONE HCL 50 MG PO TABS
50.0000 mg | ORAL_TABLET | Freq: Every day | ORAL | Status: DC
  Administered 2015-03-08: 50 mg via ORAL
  Filled 2015-03-08: qty 1

## 2015-03-08 MED ORDER — VITAMIN B-1 100 MG PO TABS
100.0000 mg | ORAL_TABLET | Freq: Every day | ORAL | Status: DC
  Administered 2015-03-09 – 2015-03-10 (×2): 100 mg via ORAL
  Filled 2015-03-08 (×2): qty 1

## 2015-03-08 MED ORDER — ESCITALOPRAM OXALATE 10 MG PO TABS
10.0000 mg | ORAL_TABLET | Freq: Every day | ORAL | Status: DC
Start: 2015-03-08 — End: 2015-03-12
  Administered 2015-03-08 – 2015-03-12 (×5): 10 mg via ORAL
  Filled 2015-03-08 (×5): qty 1

## 2015-03-08 MED ORDER — ADULT MULTIVITAMIN W/MINERALS CH
1.0000 | ORAL_TABLET | Freq: Every day | ORAL | Status: DC
  Administered 2015-03-08 – 2015-03-13 (×5): 1 via ORAL
  Filled 2015-03-08 (×5): qty 1

## 2015-03-08 NOTE — Tx Team (Signed)
Initial Interdisciplinary Treatment Plan   PATIENT STRESSORS: Marital or family conflict Substance abuse Depression   PATIENT STRENGTHS: Ability for insight Average or above average intelligence General fund of knowledge Motivation for treatment/growth Physical Health Supportive family/friends   PROBLEM LIST: Problem List/Patient Goals Date to be addressed Date deferred Reason deferred Estimated date of resolution  Risk for suicide 03/07/15     Depression 03/07/15     SA 03/07/15     "want help with my depression" 03/07/15                                    DISCHARGE CRITERIA:  Improved stabilization in mood, thinking, and/or behavior Verbal commitment to aftercare and medication compliance  PRELIMINARY DISCHARGE PLAN: Attend aftercare/continuing care group Outpatient therapy  PATIENT/FAMIILY INVOLVEMENT: This treatment plan has been presented to and reviewed with the patient, Ethan Harvey.  The patient and family have been given the opportunity to ask questions and make suggestions.  Jacques Navy A 03/08/2015, 12:00 AM

## 2015-03-08 NOTE — Progress Notes (Signed)
   03/08/15 1700  Clinical Encounter Type  Visited With Patient  Visit Type Spiritual support  Spiritual Encounters  Spiritual Needs Emotional  Stress Factors  Patient Stress Factors Financial concerns   Faith: Animism Status: 19 yr male/MDD (major depressive disorder), recurrent episode, severe Family: Mother in his life but not present today/girlfriend of 2 years Visit Assessment: The chaplain visited with patient in consultation. He shared that he is feeling much better but was having thoughts of sadness that he could not control or stop. He said that he couldn't get out of the sad state of mind. 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 He said that he does currently do drugs like Marijuana and he drinks. He shared that he believes in poly Gods and he shared that he believes in a higher being but does not name it. He also shared that has girlfriend encouraged him to visit the hospital. The chaplain offered a listening ear and gave encouraging words.  Pastoral care can be reached via pager [phone] and online

## 2015-03-08 NOTE — H&P (Addendum)
Psychiatric Admission Assessment Adult  Patient Identification: Ethan Harvey MRN:  [id number] Date of Evaluation:  03/08/2015 Chief Complaint:  depression "I have been like depressed since middle school." Principal Diagnosis: <principal problem not specified> Diagnosis:   Patient Active Problem List   Diagnosis Date Noted  . MDD (major depressive disorder), recurrent episode, severe [F33.2] 03/07/2015   History of Present Illness: Patient indicated that he's been depressed since middle school however he states over the past year it is worsened. He states the things that he used to do to help his depression have stopped working. He discussed that he used to do things like cut and do substances that will be discussed further below. He states that he has been using Wii daily and uses a quarter of an ounce a day. He states he's been using alcohol daily. He states that during the week Monday through Thursday he might consume 1-240 ounce beers. He states on the weekend days he will take 2-340 ounces on each weekend day. He states cocaine use he uses approximately 3 days a week and will use "as much as I can get." He states typically this will range anywhere from 1 g to 6 g a day. He states he also uses hallucinogens that include shrooms, acid and DMT. He states that he has been using marijuana since middle school. He states cocaine use as occurred over the past 2 years. He states that the hallucinogen use has been for the past 3 or 4 years. He states his longest period of sobriety from any of these substances since their onset of use has been one week. He states the reason he was abstinent was he simply cannot get anything.  He endorses depressive symptoms of irregular sleep. However he states that he can go 3-4 days without sleeping and then he'll sleep a day. He endorses anhedonia, low energy, poor appetite although he states his appetite has never been good. Currently he has suicidal ideation. He  denies symptoms of mania. He denies auditory or visual hallucinations that are clear. He states occasional get a sense that he seeing a shadow but he'll turn and will cannot see anything that is well-formed. In regards to auditory hallucinations he states sometimes he might think someone's calling him but he'll realize that is not the case. He denies any clear conversations or commands. Elements:  Duration:  As noted above. Associated Signs/Symptoms: Depression Symptoms:  depressed mood, anhedonia, insomnia, fatigue, suicidal thoughts without plan, loss of energy/fatigue, disturbed sleep, decreased appetite, (Hypo) Manic Symptoms:  Denied Anxiety Symptoms:  Denied any Psychotic Symptoms:  None PTSD Symptoms: Negative Total Time spent with patient: 1 hour Patient denies any past psychiatric hospitalizations. He does endorse a suicide attempt 2 years ago when he took pills and alcohol. He never got any treatment for this and states that he just fell asleep. He also endorses cutting that started in middle school and he used it for coping. He states initially he would cut designs and himself and then it transitioned to cutting his wrists. He states he has not done any of this cutting for the past 2-3 years. Past Medical History:  Past Medical History  Diagnosis Date  . Scoliosis   . Asthma   . Thyroid disease Per mother  . Depression    History reviewed. No pertinent past surgical history. Family History: History reviewed. No pertinent family history. Social History:  History  Alcohol Use  . Yes    Comment: socially  History  Drug Use  . Yes  . Special: Cocaine, Marijuana    History   Social History  . Marital Status: Single    Spouse Name: N/A  . Number of Children: N/A  . Years of Education: N/A   Social History Main Topics  . Smoking status: Current Every Day Smoker -- 1.00 packs/day for 1 years    Types: Cigarettes  . Smokeless tobacco: Not on file  . Alcohol Use:  Yes     Comment: socially  . Drug Use: Yes    Special: Cocaine, Marijuana  . Sexual Activity: Yes    Birth Control/ Protection: Condom   Other Topics Concern  . None   Social History Narrative   Additional Social History:    patient lives with his mother. It does appear there is a brother who has issues with substances that is in and out of the home. Patient is a high Printmaker. He denied any repetition are special education but states his grades were "average." In addition to the substances discussed above he does use cigarettes 1-1/2-1 pack per day and has done so for the past 3-4 years.                        Musculoskeletal: Strength & Muscle Tone: within normal limits Gait & Station: normal Patient leans: N/A  Psychiatric Specialty Exam: Physical Exam  Review of Systems  Constitutional: Negative for fever.  Eyes: Negative for blurred vision and double vision.  Cardiovascular: Negative for chest pain.  Gastrointestinal: Positive for constipation.  Genitourinary: Negative for dysuria.  Musculoskeletal: Positive for back pain (States he has had back pain since he was a kid.).  Skin: Negative for rash.  Neurological: Negative for dizziness and headaches.  Psychiatric/Behavioral: Positive for depression, suicidal ideas and substance abuse. Negative for hallucinations and memory loss. The patient has insomnia. The patient is not nervous/anxious.     Blood pressure 123/72, pulse 70, temperature 98.1 F (36.7 C), temperature source Oral, resp. rate 18, height $RemoveBe'5\' 7"'[id number]$  (1.702 m), weight 72.576 kg (160 lb), SpO2 100 %.Body mass index is 25.05 kg/(m^2).  General Appearance: Fairly Groomed  Engineer, water::  Good  Speech:  Normal Rate  Volume:  Normal  Mood:  Okay  Affect:  Blunt  Thought Process:  Linear and Logical  Orientation:  Full (Time, Place, and Person)  Thought Content:  Negative  Suicidal Thoughts:  Yes.  without intent/plan  Homicidal Thoughts:  No   Memory:  Immediate;   Good Recent;   Good Remote;   Good  Judgement:  Poor  Insight:  Fair  Psychomotor Activity:  Negative  Concentration:  Good  Recall:  Good  Fund of Knowledge:Fair  Language: Good  Akathisia:  Negative  Handed:  Right  AIMS (if indicated):     Assets:  Communication Skills Desire for Improvement Social Support  ADL's:  Intact  Cognition: WNL  Sleep:  Number of Hours: 4.3   Risk to Self: Is patient at risk for suicide?: Yes (pt contracts for safety) Risk to Others:   Prior Inpatient Therapy:   Prior Outpatient Therapy:    Alcohol Screening: 1. How often do you have a drink containing alcohol?: 2 to 3 times a week 2. How many drinks containing alcohol do you have on a typical day when you are drinking?: 5 or 6 3. How often do you have six or more drinks on one occasion?: Weekly Preliminary Score: 5 4.  How often during the last year have you found that you were not able to stop drinking once you had started?: Less than monthly 5. How often during the last year have you failed to do what was normally expected from you becasue of drinking?: Never 6. How often during the last year have you needed a first drink in the morning to get yourself going after a heavy drinking session?: Never 7. How often during the last year have you had a feeling of guilt of remorse after drinking?: Never 8. How often during the last year have you been unable to remember what happened the night before because you had been drinking?: Less than monthly 9. Have you or someone else been injured as a result of your drinking?: No 10. Has a relative or friend or a doctor or another health worker been concerned about your drinking or suggested you cut down?: No Alcohol Use Disorder Identification Test Final Score (AUDIT): 10 Brief Intervention: Patient declined brief intervention  Allergies:   Allergies  Allergen Reactions  . Penicillins Other (See Comments)    Generalized "blisters"  .  Sulfa Antibiotics Other (See Comments)    "blisters" in mouth.   Lab Results:  Results for orders placed or performed during the hospital encounter of 03/07/15 (from the past 48 hour(s))  Comprehensive metabolic panel     Status: Abnormal   Collection Time: 03/07/15  2:36 PM  Result Value Ref Range   Sodium 140 135 - 145 mmol/L   Potassium 3.7 3.5 - 5.1 mmol/L   Chloride 104 101 - 111 mmol/L   CO2 27 22 - 32 mmol/L   Glucose, Bld 77 65 - 99 mg/dL   BUN 14 6 - 20 mg/dL   Creatinine, Ser 1.00 0.61 - 1.24 mg/dL   Calcium 9.7 8.9 - 10.3 mg/dL   Total Protein 8.2 (H) 6.5 - 8.1 g/dL   Albumin 4.9 3.5 - 5.0 g/dL   AST 22 15 - 41 U/L   ALT 15 (L) 17 - 63 U/L   Alkaline Phosphatase 49 38 - 126 U/L   Total Bilirubin 0.7 0.3 - 1.2 mg/dL   GFR calc non Af Amer >60 >60 mL/min   GFR calc Af Amer >60 >60 mL/min    Comment: (NOTE) The eGFR has been calculated using the CKD EPI equation. This calculation has not been validated in all clinical situations. eGFR's persistently <60 mL/min signify possible Chronic Kidney Disease.    Anion gap 9 5 - 15  Ethanol (ETOH)     Status: None   Collection Time: 03/07/15  2:36 PM  Result Value Ref Range   Alcohol, Ethyl (B) <5 <5 mg/dL    Comment:        LOWEST DETECTABLE LIMIT FOR SERUM ALCOHOL IS 5 mg/dL FOR MEDICAL PURPOSES ONLY   Salicylate level     Status: None   Collection Time: 03/07/15  2:36 PM  Result Value Ref Range   Salicylate Lvl <5.4 2.8 - 30.0 mg/dL  Acetaminophen level     Status: Abnormal   Collection Time: 03/07/15  2:36 PM  Result Value Ref Range   Acetaminophen (Tylenol), Serum <10 (L) 10 - 30 ug/mL    Comment:        THERAPEUTIC CONCENTRATIONS VARY SIGNIFICANTLY. A RANGE OF 10-30 ug/mL MAY BE AN EFFECTIVE CONCENTRATION FOR MANY PATIENTS. HOWEVER, SOME ARE BEST TREATED AT CONCENTRATIONS OUTSIDE THIS RANGE. ACETAMINOPHEN CONCENTRATIONS >150 ug/mL AT 4 HOURS AFTER INGESTION AND >50 ug/mL AT  12 HOURS AFTER INGESTION  ARE OFTEN ASSOCIATED WITH TOXIC REACTIONS.   CBC     Status: None   Collection Time: 03/07/15  2:36 PM  Result Value Ref Range   WBC 8.0 3.8 - 10.6 K/uL   RBC 4.75 4.40 - 5.90 MIL/uL   Hemoglobin 14.2 13.0 - 18.0 g/dL   HCT 42.4 40.0 - 52.0 %   MCV 89.3 80.0 - 100.0 fL   MCH 30.0 26.0 - 34.0 pg   MCHC 33.6 32.0 - 36.0 g/dL   RDW 12.7 11.5 - 14.5 %   Platelets 210 150 - 440 K/uL  Urine Drug Screen, Qualitative ([facility] only)     Status: Abnormal   Collection Time: 03/07/15  2:36 PM  Result Value Ref Range   Tricyclic, Ur Screen NONE DETECTED NONE DETECTED   Amphetamines, Ur Screen NONE DETECTED NONE DETECTED   MDMA (Ecstasy)Ur Screen NONE DETECTED NONE DETECTED   Cocaine Metabolite,Ur [place] POSITIVE (A) NONE DETECTED   Opiate, Ur Screen NONE DETECTED NONE DETECTED   Phencyclidine (PCP) Ur S NONE DETECTED NONE DETECTED   Cannabinoid 50 Ng, Ur [place] POSITIVE (A) NONE DETECTED   Barbiturates, Ur Screen NONE DETECTED NONE DETECTED   Benzodiazepine, Ur Scrn NONE DETECTED NONE DETECTED   Methadone Scn, Ur NONE DETECTED NONE DETECTED    Comment: (NOTE) 165  Tricyclics, urine               Cutoff 1000 ng/mL 200  Amphetamines, urine             Cutoff 1000 ng/mL 300  MDMA (Ecstasy), urine           Cutoff 500 ng/mL 400  Cocaine Metabolite, urine       Cutoff 300 ng/mL 500  Opiate, urine                   Cutoff 300 ng/mL 600  Phencyclidine (PCP), urine      Cutoff 25 ng/mL 700  Cannabinoid, urine              Cutoff 50 ng/mL 800  Barbiturates, urine             Cutoff 200 ng/mL 900  Benzodiazepine, urine           Cutoff 200 ng/mL 1000 Methadone, urine                Cutoff 300 ng/mL 1100 1200 The urine drug screen provides only a preliminary, unconfirmed 1300 analytical test result and should not be used for non-medical 1400 purposes. Clinical consideration and professional judgment should 1500 be applied to any positive drug screen result due to possible 1600 interfering substances. A  more specific alternate chemical method 1700 must be used in order to obtain a confirmed analytical result.  1800 Gas chromato graphy / mass spectrometry (GC/MS) is the preferred 1900 confirmatory method.    Current Medications: Current Facility-Administered Medications  Medication Dose Route Frequency Provider Last Rate Last Dose  . acetaminophen (TYLENOL) tablet 650 mg  650 mg Oral Q6H PRN Dewain Penning, MD      . alum & mag hydroxide-simeth (MAALOX/MYLANTA) 200-200-20 MG/5ML suspension 30 mL  30 mL Oral Q4H PRN Dewain Penning, MD      . escitalopram (LEXAPRO) tablet 10 mg  10 mg Oral Daily Marjie Skiff, MD      . hydrOXYzine (ATARAX/VISTARIL) tablet 25 mg  25 mg Oral QHS Marjie Skiff, MD      .  magnesium hydroxide (MILK OF MAGNESIA) suspension 30 mL  30 mL Oral Daily PRN Dewain Penning, MD      . nicotine (NICOTROL) 10 MG inhaler 1 continuous puffing  1 continuous puffing Inhalation PRN Marjie Skiff, MD      . traZODone (DESYREL) tablet 50 mg  50 mg Oral QHS Marjie Skiff, MD       PTA Medications: Prescriptions prior to admission  Medication Sig Dispense Refill Last Dose  . albuterol (PROVENTIL HFA;VENTOLIN HFA) 108 (90 BASE) MCG/ACT inhaler Inhale 2 puffs into the lungs every 6 (six) hours as needed for wheezing or shortness of breath.   Not Taking at Unknown time  . ciprofloxacin (CIPRO) 500 MG tablet Take 1 tablet (500 mg total) by mouth 2 (two) times daily. (Patient not taking: Reported on 03/07/2015) 10 tablet 0 Not Taking    Previous Psychotropic Medications: Yes   Substance Abuse History in the last 12 months:  Yes.      Consequences of Substance Abuse: Patient has substance use disorder for multiple substances. Likely these have affected his mood, sleep, appetite and social advancement  Results for orders placed or performed during the hospital encounter of 03/07/15 (from the past 72 hour(s))  Comprehensive metabolic panel     Status: Abnormal    Collection Time: 03/07/15  2:36 PM  Result Value Ref Range   Sodium 140 135 - 145 mmol/L   Potassium 3.7 3.5 - 5.1 mmol/L   Chloride 104 101 - 111 mmol/L   CO2 27 22 - 32 mmol/L   Glucose, Bld 77 65 - 99 mg/dL   BUN 14 6 - 20 mg/dL   Creatinine, Ser 1.00 0.61 - 1.24 mg/dL   Calcium 9.7 8.9 - 10.3 mg/dL   Total Protein 8.2 (H) 6.5 - 8.1 g/dL   Albumin 4.9 3.5 - 5.0 g/dL   AST 22 15 - 41 U/L   ALT 15 (L) 17 - 63 U/L   Alkaline Phosphatase 49 38 - 126 U/L   Total Bilirubin 0.7 0.3 - 1.2 mg/dL   GFR calc non Af Amer >60 >60 mL/min   GFR calc Af Amer >60 >60 mL/min    Comment: (NOTE) The eGFR has been calculated using the CKD EPI equation. This calculation has not been validated in all clinical situations. eGFR's persistently <60 mL/min signify possible Chronic Kidney Disease.    Anion gap 9 5 - 15  Ethanol (ETOH)     Status: None   Collection Time: 03/07/15  2:36 PM  Result Value Ref Range   Alcohol, Ethyl (B) <5 <5 mg/dL    Comment:        LOWEST DETECTABLE LIMIT FOR SERUM ALCOHOL IS 5 mg/dL FOR MEDICAL PURPOSES ONLY   Salicylate level     Status: None   Collection Time: 03/07/15  2:36 PM  Result Value Ref Range   Salicylate Lvl <5.6 2.8 - 30.0 mg/dL  Acetaminophen level     Status: Abnormal   Collection Time: 03/07/15  2:36 PM  Result Value Ref Range   Acetaminophen (Tylenol), Serum <10 (L) 10 - 30 ug/mL    Comment:        THERAPEUTIC CONCENTRATIONS VARY SIGNIFICANTLY. A RANGE OF 10-30 ug/mL MAY BE AN EFFECTIVE CONCENTRATION FOR MANY PATIENTS. HOWEVER, SOME ARE BEST TREATED AT CONCENTRATIONS OUTSIDE THIS RANGE. ACETAMINOPHEN CONCENTRATIONS >150 ug/mL AT 4 HOURS AFTER INGESTION AND >50 ug/mL AT 12 HOURS AFTER INGESTION ARE OFTEN ASSOCIATED WITH TOXIC REACTIONS.   CBC  Status: None   Collection Time: 03/07/15  2:36 PM  Result Value Ref Range   WBC 8.0 3.8 - 10.6 K/uL   RBC 4.75 4.40 - 5.90 MIL/uL   Hemoglobin 14.2 13.0 - 18.0 g/dL   HCT 42.4 40.0 - 52.0 %    MCV 89.3 80.0 - 100.0 fL   MCH 30.0 26.0 - 34.0 pg   MCHC 33.6 32.0 - 36.0 g/dL   RDW 12.7 11.5 - 14.5 %   Platelets 210 150 - 440 K/uL  Urine Drug Screen, Qualitative ([facility] only)     Status: Abnormal   Collection Time: 03/07/15  2:36 PM  Result Value Ref Range   Tricyclic, Ur Screen NONE DETECTED NONE DETECTED   Amphetamines, Ur Screen NONE DETECTED NONE DETECTED   MDMA (Ecstasy)Ur Screen NONE DETECTED NONE DETECTED   Cocaine Metabolite,Ur [place] POSITIVE (A) NONE DETECTED   Opiate, Ur Screen NONE DETECTED NONE DETECTED   Phencyclidine (PCP) Ur S NONE DETECTED NONE DETECTED   Cannabinoid 50 Ng, Ur [place] POSITIVE (A) NONE DETECTED   Barbiturates, Ur Screen NONE DETECTED NONE DETECTED   Benzodiazepine, Ur Scrn NONE DETECTED NONE DETECTED   Methadone Scn, Ur NONE DETECTED NONE DETECTED    Comment: (NOTE) 761  Tricyclics, urine               Cutoff 1000 ng/mL 200  Amphetamines, urine             Cutoff 1000 ng/mL 300  MDMA (Ecstasy), urine           Cutoff 500 ng/mL 400  Cocaine Metabolite, urine       Cutoff 300 ng/mL 500  Opiate, urine                   Cutoff 300 ng/mL 600  Phencyclidine (PCP), urine      Cutoff 25 ng/mL 700  Cannabinoid, urine              Cutoff 50 ng/mL 800  Barbiturates, urine             Cutoff 200 ng/mL 900  Benzodiazepine, urine           Cutoff 200 ng/mL 1000 Methadone, urine                Cutoff 300 ng/mL 1100 1200 The urine drug screen provides only a preliminary, unconfirmed 1300 analytical test result and should not be used for non-medical 1400 purposes. Clinical consideration and professional judgment should 1500 be applied to any positive drug screen result due to possible 1600 interfering substances. A more specific alternate chemical method 1700 must be used in order to obtain a confirmed analytical result.  1800 Gas chromato graphy / mass spectrometry (GC/MS) is the preferred 1900 confirmatory method.     Observation Level/Precautions:   Continuous Observation  Laboratory:  Patient's labs from the emergency room were reviewed. His comprehensive metabolic panel was within normal limits with the exception of a total protein mildly elevated 8.2 and an ALT that was low at 15. His CBC was within normal limits. His urine tox was positive for cocaine and marijuana his alcohol level was less than 5.  Psychotherapy:    Medications:    Consultations:    Discharge Concerns:    Estimated LOS:  Other:     Psychological Evaluations: No   Treatment Plan Summary: Daily contact with patient to assess and evaluate symptoms and progress in treatment, Medication management and Plan   Depression  Will start escitalopram 10 mg daily. Risk and benefits have been discussed patient able consent.   Substance use disorder-alcohol will put him on a CIWA given his heavy use of alcohol.  Cocaine use disorder, severe, hallucinogenic disorder moderate, cannabis use disorder severe-engage in groups, likely need to establish an outpatient program focused on remaining abstinent from the substances.  Insomnia-continue trazodone and Vistaril.  Back pain-Tylenol when necessary  Medical Decision Making:  Established Problem, Worsening (2) and Review of Medication Regimen & Side Effects (2)  I certify that inpatient services furnished can reasonably be expected to improve the patient's condition.   Faith Rogue 7/31/201612:02 PM

## 2015-03-08 NOTE — [facility] Group Notes (Signed)
[facility] LCSW Group Therapy  03/08/2015 3:23 PM  Type of Therapy:  Group Therapy  Participation Level:  Minimal  Participation Quality:  Attentive  Affect:  Depressed  Cognitive:  Alert  Insight:  Limited  Engagement in Therapy:  Limited  Modes of Intervention:  Discussion, Education, Socialization and Support  Summary of Progress/Problems: Patients identify obstacles, self-sabotaging and enabling behaviors. Patients explore aspects of self sabotage and enabling and how to limit these self-destructive behaviors in everyday life. Pt attended group and stayed the entire time. He sat quietly and listened to other group members.   Ethan Harvey L Brizeida Mcmurry MSW, LCSWA  03/08/2015, 3:23 PM  

## 2015-03-08 NOTE — Plan of Care (Signed)
Problem: Ineffective individual coping Goal: LTG: Patient will report a decrease in negative feelings Outcome: Progressing Denies SI/HI at this time. Coping well, cooperative with current plan of care.

## 2015-03-08 NOTE — Progress Notes (Signed)
Patient with depressed affect and cooperative behavior with meals and plan of care. No meds scheduled at this time, uses prn Nicotrol inhaler with good effect. Orient to meds, current plan of care and [place] as needed so not to overwhelm patient who is slightly anxious. Good adls, good appetite. Encouraged to attend therapy groups to learn and initiate coping skills for management of stressors and diagnosis. Safety maintained.

## 2015-03-08 NOTE — ED Notes (Signed)
BEHAVIORAL HEALTH ROUNDING Patient sleeping: No. Patient alert and oriented: yes Behavior appropriate: No.; If no, describe:   Nutrition and fluids offered: Yes  Toileting and hygiene offered: Yes  Sitter present: no Law enforcement present: Yes  and ODS

## 2015-03-08 NOTE — Progress Notes (Signed)
Patient ID: Ethan Harvey, male   DOB: 01-Nov-1995, 19 y.o.   MRN: [id number]  Admission Note:  D:19 yr male who presents [facility] in no acute distress for the treatment of SI and Depression. Pt appears flat and depressed. Pt was calm and cooperative with admission process. Pt presents with passive SI and contracts for safety upon admission. Pt denies AVH . Pt stated he has HI towards brother. Pt stated he has had depression since a young child, but Sx have increases  In past 3 weeks where pt could not handle them. Pt stated his SI increased at that time also. Pt came to the hospital after his Gf convinced him to come get help, before he acted on his thoughts.   A:Skin was assessed and found to be clear of any abnormal marks apart from multiple tattoos. PT searched and no contraband found, POC and unit policies explained and understanding verbalized. Consents obtained. Food and fluids offered, and  Accepted.  R: Pt had no additional questions or concerns.

## 2015-03-08 NOTE — ED Notes (Signed)
BEHAVIORAL HEALTH ROUNDING Patient sleeping: Yes.   Patient alert and oriented: not applicable Behavior appropriate: Yes.  ; If no, describe:   Nutrition and fluids offered: No Toileting and hygiene offered: No Sitter present: no Law enforcement present: Yes  and ODS  ENVIRONMENTAL ASSESSMENT Potentially harmful objects out of patient reach: Yes.   Personal belongings secured: Yes.   Patient dressed in hospital provided attire only: Yes.   Plastic bags out of patient reach: Yes.   Patient care equipment (cords, cables, call bells, lines, and drains) shortened, removed, or accounted for: Yes.   Equipment and supplies removed from bottom of stretcher: Yes.   Potentially toxic materials out of patient reach: Yes.   Sharps container removed or out of patient reach: Yes.    

## 2015-03-08 NOTE — [facility] Suicide Risk Assessment (Signed)
[facility] Admission Suicide Risk Assessment   Nursing information obtained from:    Demographic factors:    Current Mental Status:    Loss Factors:    Historical Factors:    Risk Reduction Factors:    Total Time spent with patient: 1 hour Principal Problem: <principal problem not specified> Diagnosis:   Patient Active Problem List   Diagnosis Date Noted  . MDD (major depressive disorder), recurrent episode, severe [F33.2] 03/07/2015     Continued Clinical Symptoms:  Alcohol Use Disorder Identification Test Final Score (AUDIT): 10 The "Alcohol Use Disorders Identification Test", Guidelines for Use in Primary Care, Second Edition.  World Science writer [facility]). Score between 0-7:  no or low risk or alcohol related problems. Score between 8-15:  moderate risk of alcohol related problems. Score between 16-19:  high risk of alcohol related problems. Score 20 or above:  warrants further diagnostic evaluation for alcohol dependence and treatment.   CLINICAL FACTORS:   Depression:   Anhedonia Impulsivity   Musculoskeletal: Strength & Muscle Tone: within normal limits Gait & Station: normal Patient leans: N/A  Psychiatric Specialty Exam: Physical Exam  ROS  Blood pressure 123/72, pulse 70, temperature 98.1 F (36.7 C), temperature source Oral, resp. rate 18, height  (1.702 m), weight 72.576 kg (160 lb), SpO2 100 %.Body mass index is 25.05 kg/(m^2).  General Appearance: Fairly Groomed  See H and P.                                                     COGNITIVE FEATURES THAT CONTRIBUTE TO RISK:  None    SUICIDE RISK:   Moderate:  Frequent suicidal ideation with limited intensity, and duration, some specificity in terms of plans, no associated intent, good self-control, limited dysphoria/symptomatology, some risk factors present, and identifiable protective factors, including available and accessible social support.  PLAN OF CARE: Patient has risk  factors of age, past suicide attempts 2 years ago, substance use disorder and gender. Protective factors are desire for improvement and some social support.  Medical Decision Making:  Established Problem, Worsening (2) and Review of New Medication or Change in Dosage (2)  I certify that inpatient services furnished can reasonably be expected to improve the patient's condition.   Wallace Going 03/08/2015, 12:14 PM

## 2015-03-09 DIAGNOSIS — F332 Major depressive disorder, recurrent severe without psychotic features: Secondary | ICD-10-CM

## 2015-03-09 MED ORDER — CARBAMAZEPINE 200 MG PO TABS
200.0000 mg | ORAL_TABLET | Freq: Two times a day (BID) | ORAL | Status: DC
  Administered 2015-03-09 – 2015-03-13 (×9): 200 mg via ORAL
  Filled 2015-03-09 (×9): qty 1

## 2015-03-09 MED ORDER — TRAZODONE HCL 100 MG PO TABS
100.0000 mg | ORAL_TABLET | Freq: Every day | ORAL | Status: DC
  Administered 2015-03-09 – 2015-03-12 (×4): 100 mg via ORAL
  Filled 2015-03-09 (×4): qty 1

## 2015-03-09 NOTE — Plan of Care (Signed)
Problem: Ineffective individual coping Goal: LTG: Patient will report a decrease in negative feelings Outcome: Progressing Pt stated he was feeling a little better today compared to yesrterday  Problem: Alteration in mood Goal: LTG-Patient reports reduction in suicidal thoughts (Patient reports reduction in suicidal thoughts and is able to verbalize a safety plan for whenever patient is feeling suicidal)  Outcome: Progressing Pt denies SI at this time

## 2015-03-09 NOTE — [facility] Group Notes (Signed)
[facility] Group Notes:  (Nursing/MHT/Case Management/Adjunct)  Date:  03/09/2015  Time:  1:46 PM  Type of Therapy:  Psychoeducational Skills  Participation Level:  Active  Participation Quality:  Attentive and Sharing  Affect:  Appropriate  Cognitive:  Oriented  Insight:  Appropriate and Good  Engagement in Group:  Engaged  Modes of Intervention:  Discussion, Education and Exploration  Summary of Progress/Problems:  Foy Guadalajara 03/09/2015, 1:46 PM
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
## 2015-03-09 NOTE — Plan of Care (Signed)
Problem: Diagnosis: Increased Risk For Suicide Attempt Goal: STG-Patient Will Attend All Groups On The Unit Outcome: Progressing Patient motivated for treatment and is attending all groups.

## 2015-03-09 NOTE — Progress Notes (Signed)
D: Pt denies SI/HI/AVH. Pt is pleasant and cooperative. Pt still appears apprehensive about  being here. Pt stated he was still getting used to the facility. Pt seen interacting in dayroom, pt stated it got too loud for him earlier and he needed something to calm his nerves.   A: Pt was offered support and encouragement. Pt was given scheduled medications. Pt was encourage to attend groups. Q 15 minute checks were done for safety.   R:Pt attends groups and interacts well with peers and staff. Pt is taking medication. Pt has no complaints at this time .Pt receptive to treatment and safety maintained on unit.

## 2015-03-09 NOTE — Progress Notes (Signed)
Patient is very quiet and guarded but pleasant. Affect is constricted but shows some range. Mood is calm. He endorses SI but contracts for safety. Taking prescribed meds and attending groups. Remains on q 15 minute checks. Emotional support offered. Will continue to monitor.

## 2015-03-10 MED ORDER — THIAMINE HCL 100 MG/ML IJ SOLN: 100.0000 mg | Freq: Every day | INTRAMUSCULAR | Status: DC

## 2015-03-10 MED ORDER — VITAMIN B-1 100 MG PO TABS
100.0000 mg | ORAL_TABLET | Freq: Every day | ORAL | Status: DC
  Administered 2015-03-10 – 2015-03-12 (×3): 100 mg via ORAL
  Filled 2015-03-10 (×2): qty 1

## 2015-03-10 NOTE — Progress Notes (Signed)
D: Patient denies SI/HI/AVH. Patient affect is flat and his mood is depressed.  Patient did attend evening group. Patient visible on the milieu. No distress noted. A: Support and encouragement offered. Scheduled medications given to pt. Q 15 min checks continued for patient safety. R: Patient receptive. Patient remains safe on the unit.   

## 2015-03-10 NOTE — [facility] Group Notes (Signed)
[facility] Group Notes:  (Nursing/MHT/Case Management/Adjunct)  Date:  03/10/2015  Time:  5:29 PM  Type of Therapy:  Psychoeducational Skills  Participation Level:  Active  Participation Quality:  Appropriate, Attentive, Sharing and Supportive  Affect:  Appropriate  Cognitive:  Alert, Appropriate and Oriented  Insight:  Good  Engagement in Group:  Engaged  Modes of Intervention:  Activity, Clarification, Discussion and Exploration  Summary of Progress/Problems:  Ethan Harvey 03/10/2015, 5:29 PM

## 2015-03-10 NOTE — Plan of Care (Signed)
Problem: Consults Goal: [facility] General Treatment Patient Education Outcome: Progressing Patient cooperative with meds, groups and plan of care.

## 2015-03-10 NOTE — [facility] Group Notes (Signed)
[facility] LCSW Aftercare Discharge Planning Group Note  03/10/2015 8:26 AM  Participation Quality:  Attentive  Affect:  Appropriate  Cognitive:  Appropriate  Insight:  Developing/Improving  Engagement in Group:  Developing/Improving  Modes of Intervention:  Discussion, Education and Support  Summary of Progress/Problems: patient goal is to follow with self care, eating well and remaining well  Corri Delapaz M 03/10/2015, 8:26 AM

## 2015-03-10 NOTE — Progress Notes (Signed)
Patient attends AA group this evening.

## 2015-03-10 NOTE — Progress Notes (Signed)
No distress, no complaint. Social with peers, verbalizes needs appropriately, safety maintained.

## 2015-03-10 NOTE — Progress Notes (Signed)
[facility] MD Progress Note  03/10/2015 3:37 PM MERLON ALCORTA  MRN:  [id number]  Subjective: [name] reports further improvement. His mood is better, affect is brighter. He no longer has urges to hurt himself. He met with his girlfriend and his mother last night. He tried to explain to his mother that he is not a drug addict but is using drugs to treat his depression. Apparently the mother didn't diet. He is not interested in participating in residential rehabilitation. His girlfriend obtained some information on the facilities in our area. That he would prefer to do with his weight with outpatient services first. He seems to tolerate medications well. There was a situation and on the unit yesterday but he was able to keep his cool. It did make him anxious. He tolerates alcohol detox well. He is asking for discharge tomorrow. He is still uncertain if he will keep his job but does not believe that this is a problem and thinks that he can get another job any time.   Principal Problem: Major depressive disorder, recurrent severe without psychotic features Diagnosis:   Patient Active Problem List   Diagnosis Date Noted  . Major depressive disorder, recurrent severe without psychotic features [F33.2] 03/07/2015   Total Time spent with patient: 20 minutes   Past Medical History:  Past Medical History  Diagnosis Date  . Scoliosis   . Asthma   . Thyroid disease Per mother  . Depression    History reviewed. No pertinent past surgical history. Family History: History reviewed. No pertinent family history. Social History:  History  Alcohol Use  . Yes    Comment: socially     History  Drug Use  . Yes  . Special: Cocaine, Marijuana    History   Social History  . Marital Status: Single    Spouse Name: N/A  . Number of Children: N/A  . Years of Education: N/A   Social History Main Topics  . Smoking status: Current Every Day Smoker -- 1.00 packs/day for 1 years    Types: Cigarettes  .  Smokeless tobacco: Not on file  . Alcohol Use: Yes     Comment: socially  . Drug Use: Yes    Special: Cocaine, Marijuana  . Sexual Activity: Yes    Birth Control/ Protection: Condom   Other Topics Concern  . None   Social History Narrative   Additional History:    Sleep: Good  Appetite:  Good   Assessment:   Musculoskeletal: Strength & Muscle Tone: within normal limits Gait & Station: normal Patient leans: N/A   Psychiatric Specialty Exam: Physical Exam  Nursing note and vitals reviewed.   Review of Systems  All other systems reviewed and are negative.   Blood pressure 117/80, pulse 87, temperature 98 F (36.7 C), temperature source Oral, resp. rate 20, height '5\' 7"'  (1.702 m), weight 72.576 kg (160 lb), SpO2 100 %.Body mass index is 25.05 kg/(m^2).  General Appearance: Casual  Eye Contact::  Good  Speech:  Normal Rate  Volume:  Normal  Mood:  Euthymic  Affect:  Appropriate  Thought Process:  Goal Directed  Orientation:  Full (Time, Place, and Person)  Thought Content:  WDL  Suicidal Thoughts:  No  Homicidal Thoughts:  No  Memory:  Immediate;   Fair Recent;   Fair Remote;   Fair  Judgement:  Fair  Insight:  Fair  Psychomotor Activity:  Normal  Concentration:  Fair  Recall:  [employer] of Knowledge:Fair  Language: Fair  Akathisia:  No  Handed:  Right  AIMS (if indicated):     Assets:  Communication Skills Desire for Improvement Financial Resources/Insurance Housing Intimacy Physical Health Resilience Social Support  ADL's:  Intact  Cognition: WNL  Sleep:  Number of Hours: 6     Current Medications: Current Facility-Administered Medications  Medication Dose Route Frequency Provider Last Rate Last Dose  . acetaminophen (TYLENOL) tablet 650 mg  650 mg Oral Q6H PRN Dewain Penning, MD   650 mg at 03/10/15 0850  . alum & mag hydroxide-simeth (MAALOX/MYLANTA) 200-200-20 MG/5ML suspension 30 mL  30 mL Oral Q4H PRN Dewain Penning, MD      .  carbamazepine (TEGRETOL) tablet 200 mg  200 mg Oral BID Clovis Fredrickson, MD   200 mg at 03/10/15 0850  . escitalopram (LEXAPRO) tablet 10 mg  10 mg Oral Daily Marjie Skiff, MD   10 mg at 03/10/15 0849  . folic acid (FOLVITE) tablet 1 mg  1 mg Oral Daily Marjie Skiff, MD   1 mg at 03/10/15 0850  . hydrOXYzine (ATARAX/VISTARIL) tablet 25 mg  25 mg Oral QHS Marjie Skiff, MD   25 mg at 03/09/15 2130  . LORazepam (ATIVAN) tablet 1 mg  1 mg Oral Q6H PRN Marjie Skiff, MD   1 mg at 03/10/15 1247   Or  . LORazepam (ATIVAN) injection 1 mg  1 mg Intravenous Q6H PRN Marjie Skiff, MD      . magnesium hydroxide (MILK OF MAGNESIA) suspension 30 mL  30 mL Oral Daily PRN Dewain Penning, MD   30 mL at 03/08/15 1300  . multivitamin with minerals tablet 1 tablet  1 tablet Oral Daily Marjie Skiff, MD   1 tablet at 03/10/15 [phone]  . nicotine (NICOTROL) 10 MG inhaler 1 continuous puffing  1 continuous puffing Inhalation PRN Marjie Skiff, MD   1 continuous puffing at 03/10/15 0849  . thiamine (VITAMIN B-1) tablet 100 mg  100 mg Oral Daily Kiora Hallberg B Kregg Cihlar, MD   100 mg at 03/10/15 3267   Or  . thiamine (B-1) injection 100 mg  100 mg Intramuscular Daily Chanetta Moosman B Micaila Ziemba, MD      . traZODone (DESYREL) tablet 100 mg  100 mg Oral QHS Jamaurie Bernier B Xiamara Hulet, MD   100 mg at 03/09/15 2130    Lab Results: No results found for this or any previous visit (from the past 48 hour(s)).  Physical Findings: AIMS: Facial and Oral Movements Muscles of Facial Expression: None, normal Lips and Perioral Area: None, normal Jaw: None, normal Tongue: None, normal,Extremity Movements Upper (arms, wrists, hands, fingers): None, normal Lower (legs, knees, ankles, toes): None, normal, Trunk Movements Neck, shoulders, hips: None, normal, Overall Severity Severity of abnormal movements (highest score from questions above): None, normal Incapacitation due to abnormal movements: None, normal Patient's  awareness of abnormal movements (rate only patient's report): No Awareness, Dental Status Current problems with teeth and/or dentures?: No Does patient usually wear dentures?: No  CIWA:  CIWA-Ar Total: 0 COWS:  COWS Total Score: 0  Treatment Plan Summary: Daily contact with patient to assess and evaluate symptoms and progress in treatment and Medication management   Medical Decision Making:  Established Problem, Stable/Improving (1), Review of Psycho-Social Stressors (1), Review or order clinical lab tests (1), Review of Medication Regimen & Side Effects (2) and Review of New Medication or Change in Dosage (2)   [name] is a  19 year old male with a history of untreated depression, mood instability, substance use, admitted for suicidal ideation.  1. Suicidal ideation. The patient is able to contact for safety in the hospital.  2. Depression Will increase escitalopram to 20 mg daily. Risk and benefits have been discussed patient able consent. We will add Tegretol 200 mg twice daily for mood stabilization.  3. Substance use disorder-alcohol will put him on a CIWA given his heavy use of alcohol. He tolerates Seroquel well. Vital signs are stable.   4. Cocaine use disorder, severe, hallucinogenic disorder moderate, cannabis use disorder severe-engage in groups, likely need to establish an outpatient program focused on remaining abstinent from the substances.  5. Insomnia-Will increase trazodone 200 mg at night and continue Vistaril.   6 smoking. Nicotine products are available.  7. Disposition. He will be discharged to home with family. Follow up with [facility].     Astrid Vides 03/10/2015, 3:37 PM

## 2015-03-10 NOTE — [facility] Group Notes (Signed)
[place] LCSW Group Therapy  03/10/2015 8:27 AM  Type of Therapy:  Group Therapy  Participation Level:  Active  Participation Quality:  Appropriate  Affect:  Appropriate  Cognitive:  Appropriate  Insight:  Developing/Improving  Engagement in Therapy:  Engaged  Modes of Intervention:  Discussion, Exploration and Support  Summary of Progress/Problems:Today's topic was communication and barriers patients face when attempts to communicate feelings and needs are not met. Patients were asked to discuss their challenges and as a collective group, members provided insight and suggestions on more effective means of communication.  This patient was able to discuss how his family and himself communicate with each other. He tries to support and listen to family members but sometimes they are too dramatic and animated.He was supportive towards his peers  Joana Reamer 03/10/2015, 8:27 AM

## 2015-03-10 NOTE — Progress Notes (Signed)
Patient with sad affect, good eye contact and cooperative behavior with meals, meds and plan of care. No SI/HI at this time. Good adls and good appetite. Takes meds, therapy groups encouraged. Appropriate and support to peers in positive manner. Encouraged to attend therapy groups to learn and initiate coping skills. Safety maintained.

## 2015-03-11 MED ORDER — HYDROXYZINE HCL 25 MG PO TABS
25.0000 mg | ORAL_TABLET | Freq: Every day | ORAL | Status: DC
Start: 2015-03-11 — End: 2015-03-13

## 2015-03-11 MED ORDER — TRAZODONE HCL 100 MG PO TABS: 100.0000 mg | ORAL_TABLET | Freq: Every day | ORAL | Status: DC

## 2015-03-11 MED ORDER — ESCITALOPRAM OXALATE 10 MG PO TABS: 10.0000 mg | ORAL_TABLET | Freq: Every day | ORAL | Status: DC

## 2015-03-11 MED ORDER — CARBAMAZEPINE 200 MG PO TABS: 200.0000 mg | ORAL_TABLET | Freq: Two times a day (BID) | ORAL | Status: DC

## 2015-03-11 NOTE — [facility] Suicide Risk Assessment (Signed)
[facility] Discharge Suicide Risk Assessment   Demographic Factors:  Male, Adolescent or young adult, Caucasian and Low socioeconomic status  Total Time spent with patient: 30 minutes  Musculoskeletal: Strength & Muscle Tone: within normal limits Gait & Station: normal Patient leans: N/A  Psychiatric Specialty Exam: Physical Exam  Nursing note and vitals reviewed.   Review of Systems  All other systems reviewed and are negative.   Blood pressure 131/90, pulse 118, temperature 98.2 F (36.8 C), temperature source Oral, resp. rate 20, height  (1.702 m), weight 72.576 kg (160 lb), SpO2 100 %.Body mass index is 25.05 kg/(m^2).  General Appearance: Casual  Eye Contact::  Good  Speech:  Clear and Coherent409  Volume:  Normal  Mood:  Euthymic  Affect:  Appropriate  Thought Process:  Goal Directed  Orientation:  Full (Time, Place, and Person)  Thought Content:  WDL  Suicidal Thoughts:  No  Homicidal Thoughts:  No  Memory:  Immediate;   Fair Recent;   Fair Remote;   Fair  Judgement:  Fair  Insight:  Fair  Psychomotor Activity:  Normal  Concentration:  Fair  Recall:  [employer] of Knowledge:Fair  Language: Fair  Akathisia:  No  Handed:  Right  AIMS (if indicated):     Assets:  Communication Skills Desire for Improvement Financial Resources/Insurance Housing Intimacy Physical Health Resilience Social Support  Sleep:  Number of Hours: 6  Cognition: WNL  ADL's:  Intact   Have you used any form of tobacco in the last 30 days? (Cigarettes, Smokeless Tobacco, Cigars, and/or Pipes): Yes  Has this patient used any form of tobacco in the last 30 days? (Cigarettes, Smokeless Tobacco, Cigars, and/or Pipes) Yes, A prescription for an FDA-approved tobacco cessation medication was offered at discharge and the patient refused  Mental Status Per Nursing Assessment::   On Admission:     Current Mental Status by Physician: NA  Loss Factors: NA  Historical Factors: Family  history of mental illness or substance abuse  Risk Reduction Factors:   Sense of responsibility to family, Employed, Living with another person, especially a relative and Positive social support  Continued Clinical Symptoms:  Bipolar Disorder:   Depressive phase Alcohol/Substance Abuse/Dependencies  Cognitive Features That Contribute To Risk:  None    Suicide Risk:  Minimal: No identifiable suicidal ideation.  Patients presenting with no risk factors but with morbid ruminations; may be classified as minimal risk based on the severity of the depressive symptoms  Principal Problem: Major depressive disorder, recurrent severe without psychotic features Discharge Diagnoses:  Patient Active Problem List   Diagnosis Date Noted  . Major depressive disorder, recurrent severe without psychotic features [F33.2] 03/07/2015      Plan Of Care/Follow-up recommendations:  Activity:  AS TOLERATED. Diet:  REGULAR. Other:  KEEP FOLLOW UP APPOINTMENTS.  Is patient on multiple antipsychotic therapies at discharge:  No   Has Patient had three or more failed trials of antipsychotic monotherapy by history:  No  Recommended Plan for Multiple Antipsychotic Therapies: NA    Montavis Schubring 03/11/2015, 11:17 AM

## 2015-03-11 NOTE — Progress Notes (Signed)
D: Patient denies SI/HI/AVH. Patient affect is flat and his mood remains depressed.  Patient did attend evening group. Patient visible on the milieu. No distress noted. A: Support and encouragement offered. Scheduled medications given to pt. Q 15 min checks continued for patient safety. R: Patient receptive. Patient remains safe on the unit.

## 2015-03-11 NOTE — Progress Notes (Signed)
Patient with appropriate affect, cooperative behavior with meals, meds and plan of care. Social with peers, verbalizes needs appropriately with staff. Relies on meds for coping with stressors and encouraged to attend therapy groups and initiate more coping skills for management of diagnosis. Safety maintained. No SI/HI at this time.

## 2015-03-11 NOTE — [facility] Group Notes (Signed)
[facility] LCSW Group Therapy  03/11/2015 3:49 PM  Type of Therapy:  Group Therapy  Participation Level:  Active  Participation Quality:  Attentive  Affect:  Appropriate  Cognitive:  Appropriate  Insight:  Developing/Improving  Engagement in Therapy:  Developing/Improving  Modes of Intervention:  Discussion, Education, Exploration and Support  Summary of Progress/Problems:LCSW introduced group rules. Patient fully disclosed in group today how his addiction came to interrupt his life he reports his depression remains even after he reduces or stops drugs. He was supported by staff and peers.  Ethan Harvey M 03/11/2015, 3:49 PM

## 2015-03-11 NOTE — Plan of Care (Signed)
Problem: Consults Goal: [facility] Patient Education Outcome: Progressing Patient depress, encouragement and information provided on current plan of care, meds and therapy groups.

## 2015-03-11 NOTE — [facility] Group Notes (Signed)
[facility] Group Notes:  (Nursing/MHT/Case Management/Adjunct)  Date:  03/11/2015  Time:  1:39 PM  Type of Therapy:  Psychoeducational Skills  Participation Level:  Active  Participation Quality:  Appropriate  Affect:  Appropriate  Cognitive:  Appropriate  Insight:  Appropriate  Engagement in Group:  Engaged  Modes of Intervention:  Discussion, Education and Support  Summary of Progress/Problems:  Ethan Harvey 03/11/2015, 1:39 PM

## 2015-03-11 NOTE — Progress Notes (Signed)
Patient with anxiety following therapy group in which he states he spoke "about his drug use". One one one with patient for support and encouragement. Patient takes Tylenol 650 mg po prn with good effect on headache. Rests in bed and then awake and eats dinner in dayroom. Tells nurse his mood is slightly better. Aware to call his girlfriend this evening. Safety maintained.

## 2015-03-11 NOTE — Progress Notes (Signed)
[facility] MD Progress Note  03/11/2015 4:11 PM Ethan Harvey  MRN:  [id number]  Subjective:  Ethan Harvey was supposed to be discharged today. He felt good and not suicidal in the morning however around lunchtime he was overcome with sudden sadness. He is unable to identify any new stressors. He became suddenly suicidal and unsafe for discharge. He was crying to his nervous. He felt very overwhelmed in classes. He will stay in the hospital tomorrow and will be assessed for safety again.  Principal Problem: Major depressive disorder, recurrent severe without psychotic features Diagnosis:   Patient Active Problem List   Diagnosis Date Noted  . Major depressive disorder, recurrent severe without psychotic features [F33.2] 03/07/2015   Total Time spent with patient: 20 minutes   Past Medical History:  Past Medical History  Diagnosis Date  . Scoliosis   . Asthma   . Thyroid disease Per mother  . Depression    History reviewed. No pertinent past surgical history. Family History: History reviewed. No pertinent family history. Social History:  History  Alcohol Use  . Yes    Comment: socially     History  Drug Use  . Yes  . Special: Cocaine, Marijuana    History   Social History  . Marital Status: Single    Spouse Name: N/A  . Number of Children: N/A  . Years of Education: N/A   Social History Main Topics  . Smoking status: Current Every Day Smoker -- 1.00 packs/day for 1 years    Types: Cigarettes  . Smokeless tobacco: Not on file  . Alcohol Use: Yes     Comment: socially  . Drug Use: Yes    Special: Cocaine, Marijuana  . Sexual Activity: Yes    Birth Control/ Protection: Condom   Other Topics Concern  . None   Social History Narrative   Additional History:    Sleep: Fair  Appetite:  Fair   Assessment:   Musculoskeletal: Strength & Muscle Tone: within normal limits Gait & Station: normal Patient leans: N/A   Psychiatric Specialty Exam: Physical Exam   Nursing note and vitals reviewed.   Review of Systems  All other systems reviewed and are negative.   Blood pressure 131/90, pulse 118, temperature 98.2 F (36.8 C), temperature source Oral, resp. rate 20, height 5\' 7"  (1.702 m), weight 72.576 kg (160 lb), SpO2 100 %.Body mass index is 25.05 kg/(m^2).  General Appearance: Casual  Eye Contact::  Minimal  Speech:  Slow  Volume:  Decreased  Mood:  Hopeless and Worthless  Affect:  Blunt  Thought Process:  Goal Directed  Orientation:  Full (Time, Place, and Person)  Thought Content:  WDL  Suicidal Thoughts:  Yes.  with intent/plan  Homicidal Thoughts:  No  Memory:  Immediate;   Fair Recent;   Fair Remote;   Fair  Judgement:  Fair  Insight:  Fair  Psychomotor Activity:  Decreased  Concentration:  Fair  Recall:  [employer] of Knowledge:Fair  Language: Fair  Akathisia:  No  Handed:  Right  AIMS (if indicated):     Assets:  Communication Skills Desire for Improvement Financial Resources/Insurance  ADL's:  Intact  Cognition: WNL  Sleep:  Number of Hours: 6     Current Medications: Current Facility-Administered Medications  Medication Dose Route Frequency Provider Last Rate Last Dose  . acetaminophen (TYLENOL) tablet 650 mg  650 mg Oral Q6H PRN Beau Fanny, MD   650 mg at 03/11/15 1513  .  alum & mag hydroxide-simeth (MAALOX/MYLANTA) 200-200-20 MG/5ML suspension 30 mL  30 mL Oral Q4H PRN Beau Fanny, MD      . carbamazepine (TEGRETOL) tablet 200 mg  200 mg Oral BID Shari Prows, MD   200 mg at 03/11/15 0901  . escitalopram (LEXAPRO) tablet 10 mg  10 mg Oral Daily Kerin Salen, MD   10 mg at 03/11/15 0901  . folic acid (FOLVITE) tablet 1 mg  1 mg Oral Daily Kerin Salen, MD   1 mg at 03/11/15 0900  . hydrOXYzine (ATARAX/VISTARIL) tablet 25 mg  25 mg Oral QHS Kerin Salen, MD   25 mg at 03/10/15 2107  . magnesium hydroxide (MILK OF MAGNESIA) suspension 30 mL  30 mL Oral Daily PRN Beau Fanny, MD   30  mL at 03/11/15 1307  . multivitamin with minerals tablet 1 tablet  1 tablet Oral Daily Kerin Salen, MD   1 tablet at 03/11/15 0902  . nicotine (NICOTROL) 10 MG inhaler 1 continuous puffing  1 continuous puffing Inhalation PRN Kerin Salen, MD   1 continuous puffing at 03/11/15 0902  . thiamine (VITAMIN B-1) tablet 100 mg  100 mg Oral Daily Darnell Jeschke B Tayli Buch, MD   100 mg at 03/11/15 0902   Or  . thiamine (B-1) injection 100 mg  100 mg Intramuscular Daily Kaveri Perras B Coco Sharpnack, MD      . traZODone (DESYREL) tablet 100 mg  100 mg Oral QHS Shari Prows, MD   100 mg at 03/10/15 2107    Lab Results: No results found for this or any previous visit (from the past 48 hour(s)).  Physical Findings: AIMS: Facial and Oral Movements Muscles of Facial Expression: None, normal Lips and Perioral Area: None, normal Jaw: None, normal Tongue: None, normal,Extremity Movements Upper (arms, wrists, hands, fingers): None, normal Lower (legs, knees, ankles, toes): None, normal, Trunk Movements Neck, shoulders, hips: None, normal, Overall Severity Severity of abnormal movements (highest score from questions above): None, normal Incapacitation due to abnormal movements: None, normal Patient's awareness of abnormal movements (rate only patient's report): No Awareness, Dental Status Current problems with teeth and/or dentures?: No Does patient usually wear dentures?: No  CIWA:  CIWA-Ar Total: 0 COWS:  COWS Total Score: 0  Treatment Plan Summary: Daily contact with patient to assess and evaluate symptoms and progress in treatment and Medication management   Medical Decision Making:  Established Problem, Stable/Improving (1), Review of Psycho-Social Stressors (1), Review or order clinical lab tests (1), Review of Medication Regimen & Side Effects (2) and Review of New Medication or Change in Dosage (2)   Ethan Harvey is a 19 year old male with a history of untreated depression, mood instability,  and substance use, admitted for suicidal ideation.  1. Suicidal ideation. The patient is suicidal again and unable to contract for safety in the community.    2. Depression We started escitalopram to 10 mg daily for depression. Risk and benefits have been discussed patient able consent. We added Tegretol 200 mg twice daily for mood stabilization.  3. Alcohol detox. He completed CIWA protocol. This was uncomplicated detox. Vital signs were stable.   4. Substance abuse treatment. The patient declines residential treatment. He will follow up with [facility].   5. Insomnia. We offered Trazodone and Vistaril.   6 Smoking. Nicotine products are available.  7. Disposition. He was discharged to home with family. Follow up with [facility]     Delayni Streed 03/11/2015, 4:11  PM

## 2015-03-11 NOTE — Discharge Summary (Signed)
Physician Discharge Summary Note  Patient:  Ethan Harvey is an 19 y.o., male MRN:  [id number] DOB:  05-Dec-1995 Patient phone:  [phone] (home)  Patient address:   [address] [place] [address],  Total Time spent with patient: 30 minutes  Date of Admission:  03/07/2015 Date of Discharge: 03/11/2015  Reason for Admission:  Suicidal ideation.  History of Present Illness: Patient indicated that he's been depressed since middle school however he states over the past year it is worsened. He states the things that he used to do to help his depression have stopped working. He discussed that he used to do things like cut and do substances that will be discussed further below. He states that he has been using Wii daily and uses a quarter of an ounce a day. He states he's been using alcohol daily. He states that during the week Monday through Thursday he might consume 1-240 ounce beers. He states on the weekend days he will take 2-340 ounces on each weekend day. He states cocaine use he uses approximately 3 days a week and will use "as much as I can get." He states typically this will range anywhere from 1 g to 6 g a day. He states he also uses hallucinogens that include shrooms, acid and DMT. He states that he has been using marijuana since middle school. He states cocaine use as occurred over the past 2 years. He states that the hallucinogen use has been for the past 3 or 4 years. He states his longest period of sobriety from any of these substances since their onset of use has been one week. He states the reason he was abstinent was he simply cannot get anything.  He endorses depressive symptoms of irregular sleep. However he states that he can go 3-4 days without sleeping and then he'll sleep a day. He endorses anhedonia, low energy, poor appetite although he states his appetite has never been good. Currently he has suicidal ideation. He denies symptoms of mania. He denies auditory or visual  hallucinations that are clear. He states occasional get a sense that he seeing a shadow but he'll turn and will cannot see anything that is well-formed. In regards to auditory hallucinations he states sometimes he might think someone's calling him but he'll realize that is not the case. He denies any clear conversations or commands. Elements: Duration: As noted above. Associated Signs/Symptoms: Depression Symptoms: depressed mood, anhedonia, insomnia, fatigue, suicidal thoughts without plan, loss of energy/fatigue, disturbed sleep, decreased appetite, (Hypo) Manic Symptoms: Denied Anxiety Symptoms: Denied any Psychotic Symptoms: None PTSD Symptoms: Negative  Principal Problem: Major depressive disorder, recurrent severe without psychotic features Discharge Diagnoses: Patient Active Problem List   Diagnosis Date Noted  . Major depressive disorder, recurrent severe without psychotic features [F33.2] 03/07/2015    Musculoskeletal: Strength & Muscle Tone: within normal limits Gait & Station: normal Patient leans: N/A  Psychiatric Specialty Exam: Physical Exam  Nursing note and vitals reviewed.   Review of Systems  All other systems reviewed and are negative.   Blood pressure 131/90, pulse 118, temperature 98.2 F (36.8 C), temperature source Oral, resp. rate 20, height 5\' 7"  (1.702 m), weight 72.576 kg (160 lb), SpO2 100 %.Body mass index is 25.05 kg/(m^2).  See SRA.  Sleep:  Number of Hours: 6   Have you used any form of tobacco in the last 30 days? (Cigarettes, Smokeless Tobacco, Cigars, and/or Pipes): Yes  Has this patient used any form of tobacco in the last 30 days? (Cigarettes, Smokeless Tobacco, Cigars, and/or Pipes) Yes, A prescription for an FDA-approved tobacco cessation medication was offered at discharge and the patient refused  Past Medical History:  Past Medical History  Diagnosis  Date  . Scoliosis   . Asthma   . Thyroid disease Per mother  . Depression    History reviewed. No pertinent past surgical history. Family History: History reviewed. No pertinent family history. Social History:  History  Alcohol Use  . Yes    Comment: socially     History  Drug Use  . Yes  . Special: Cocaine, Marijuana    History   Social History  . Marital Status: Single    Spouse Name: N/A  . Number of Children: N/A  . Years of Education: N/A   Social History Main Topics  . Smoking status: Current Every Day Smoker -- 1.00 packs/day for 1 years    Types: Cigarettes  . Smokeless tobacco: Not on file  . Alcohol Use: Yes     Comment: socially  . Drug Use: Yes    Special: Cocaine, Marijuana  . Sexual Activity: Yes    Birth Control/ Protection: Condom   Other Topics Concern  . None   Social History Narrative    Past Psychiatric History: Hospitalizations:  Outpatient Care:  Substance Abuse Care:  Self-Mutilation:  Suicidal Attempts:  Violent Behaviors:   Risk to Self: Is patient at risk for suicide?: Yes (pt contracts for safety) What has been your use of drugs/alcohol within the last 12 months?: marajuana, alcoholl  Risk to Others:   Prior Inpatient Therapy:   Prior Outpatient Therapy:    Level of Care:  OP  Hospital Course:    Mr. Derrell Lolling is a 19 year old male with a history of untreated depression, mood instability, and substance use, admitted for suicidal ideation.  1. Suicidal ideation. This has resolved. The patient is able to contact for safety.   2. Depression We started escitalopram to 10 mg daily for depression. Risk and benefits have been discussed patient able consent. We added Tegretol 200 mg twice daily for mood stabilization.  3. Alcohol detox. He completed CIWA protocol. This was uncomplicated detox. Vital signs were stable.    4. Substance abuse treatment. The patient declines residential treatment. He will follow up with [facility].   5.  Insomnia. We offered Trazodone and Vistaril.   6 Smoking. Nicotine products are available.  7. Disposition. He was discharged to home with family. Follow up with [facility].  Consults:  None  Significant Diagnostic Studies:  None  Discharge Vitals:   Blood pressure 131/90, pulse 118, temperature 98.2 F (36.8 C), temperature source Oral, resp. rate 20, height  (1.702 m), weight 72.576 kg (160 lb), SpO2 100 %. Body mass index is 25.05 kg/(m^2). Lab Results:   No results found for this or any previous visit (from the past 72 hour(s)).  Physical Findings: AIMS: Facial and Oral Movements Muscles of Facial Expression: None, normal Lips and Perioral Area: None, normal Jaw: None, normal Tongue: None, normal,Extremity Movements Upper (arms, wrists, hands, fingers): None, normal Lower (legs, knees, ankles, toes): None, normal, Trunk Movements Neck, shoulders, hips: None, normal, Overall Severity Severity of abnormal movements (highest score from questions above): None, normal Incapacitation due to abnormal  movements: None, normal Patient's awareness of abnormal movements (rate only patient's report): No Awareness, Dental Status Current problems with teeth and/or dentures?: No Does patient usually wear dentures?: No  CIWA:  CIWA-Ar Total: 0 COWS:  COWS Total Score: 0   See Psychiatric Specialty Exam and Suicide Risk Assessment completed by Attending Physician prior to discharge.  Discharge destination:  Home  Is patient on multiple antipsychotic therapies at discharge:  No   Has Patient had three or more failed trials of antipsychotic monotherapy by history:  No    Recommended Plan for Multiple Antipsychotic Therapies: NA  Discharge Instructions    Diet - low sodium heart healthy    Complete by:  As directed      Increase activity slowly    Complete by:  As directed             Medication List    TAKE these medications      Indication   albuterol 108 (90 BASE)  MCG/ACT inhaler  Commonly known as:  PROVENTIL HFA;VENTOLIN HFA  Inhale 2 puffs into the lungs every 6 (six) hours as needed for wheezing or shortness of breath.      carbamazepine 200 MG tablet  Commonly known as:  TEGRETOL  Take 1 tablet (200 mg total) by mouth 2 (two) times daily.   Indication:  Manic-Depression     cetirizine 10 MG tablet  Commonly known as:  ZYRTEC  Take 10 mg by mouth daily.      escitalopram 10 MG tablet  Commonly known as:  LEXAPRO  Take 1 tablet (10 mg total) by mouth daily.   Indication:  Depression     hydrOXYzine 25 MG tablet  Commonly known as:  ATARAX/VISTARIL  Take 1 tablet (25 mg total) by mouth at bedtime.   Indication:  Anxiety Neurosis     levothyroxine 25 MCG tablet  Commonly known as:  SYNTHROID, LEVOTHROID  Take 25 mcg by mouth daily.      traZODone 100 MG tablet  Commonly known as:  DESYREL  Take 1 tablet (100 mg total) by mouth at bedtime.   Indication:  Trouble Sleeping         Follow-up recommendations:  Activity:  as tolerated. Diet:  regular. Other:  keep follow up appointments.  Comments:    Total Discharge Time: 35 min.  Signed: Britten Seyfried 03/11/2015, 11:20 AM

## 2015-03-12 MED ORDER — RISPERIDONE 1 MG PO TABS
2.0000 mg | ORAL_TABLET | Freq: Every day | ORAL | Status: DC
  Administered 2015-03-12: 2 mg via ORAL
  Filled 2015-03-12: qty 2

## 2015-03-12 MED ORDER — HYDROXYZINE HCL 25 MG PO TABS
25.0000 mg | ORAL_TABLET | Freq: Three times a day (TID) | ORAL | Status: DC
  Administered 2015-03-12 – 2015-03-13 (×3): 25 mg via ORAL
  Filled 2015-03-12 (×4): qty 1

## 2015-03-12 MED ORDER — SERTRALINE HCL 50 MG PO TABS
50.0000 mg | ORAL_TABLET | Freq: Every day | ORAL | Status: DC
  Administered 2015-03-12 – 2015-03-13 (×2): 50 mg via ORAL
  Filled 2015-03-12 (×2): qty 1

## 2015-03-12 MED ORDER — MAGNESIUM CITRATE PO SOLN
1.0000 | Freq: Once | ORAL | Status: AC
  Administered 2015-03-12: 1 via ORAL
  Filled 2015-03-12: qty 296

## 2015-03-12 NOTE — Plan of Care (Signed)
Problem: Consults Goal: Anxiety Disorder Patient Education See Patient Education Module for eduction specifics. Outcome: Progressing Writer educated pt on meditation practice and deep breathing for coping with anxiety. Pt verbalizes understanding.

## 2015-03-12 NOTE — Tx Team (Signed)
Interdisciplinary Treatment Plan Update (Adult)  Date:  03/12/2015 Time Reviewed:  4:34 PM  Progress in Treatment: Attending groups: Yes. Participating in groups:  Yes. Taking medication as prescribed:  Yes. Tolerating medication:  Yes. Family/Significant othe contact made:  No, will contact:  Mother Patient understands diagnosis:  Yes. Discussing patient identified problems/goals with staff:  Yes. Medical problems stabilized or resolved:  Yes. Denies suicidal/homicidal ideation: Yes. Issues/concerns per patient self-inventory:  Yes. Other:  New problem(s) identified: NA  Discharge Plan or Barriers: Pt plans to return home and follow up with outpatient.   Reason for Continuation of Hospitalization: Depression Medication stabilization  Anxiety   Comments:Ethan Harvey still feels overwhelmed, hopeless, this respondent, sad, and didn't vaguely suicidal. He sees no reason for such her worsening of his depression. He did get better in the first to 3 days following admission but now feels completely overwhelmed. He denies any new problems in the community but there are no legal charges no conflict at home no relationship problems. He did lose his job he thinks the he kind of new from the beginning when he missed the first day of work. He is not concerned with it. He seems to tolerate medications well. Sleep and appetite are fair. He has been an eager participant in programming. He complains that with each group he gets very emotional and more depressed. He also complains of anxiety especially in the afternoon. He has been using his coping skills to no avail.  Estimated length of stay: 2 days   New goal(s):  Review of initial/current patient goals per problem list:   1.  Goal(s): Patient will participate in aftercare plan * Met: Yes * Target date: at discharge * As evidenced by: Patient will participate within aftercare plan AEB aftercare provider and housing plan at discharge being  identified.   2.  Goal (s): Patient will exhibit decreased depressive symptoms and suicidal ideations. * Met: No *  Target date: at discharge * As evidenced by: Patient will utilize self rating of depression at 3 or below and demonstrate decreased signs of depression or be deemed stable for discharge by MD.   3.  Goal(s): Patient will demonstrate decreased signs of withdrawal due to substance abuse * Met: Yes * Target date: at discharge * As evidenced by: Patient will produce a CIWA/COWS score of 0, have stable vitals signs, and no symptoms of withdrawal.  Attendees: Patient:  Ethan Harvey 8/4/20164:34 PM  Family:   8/4/20164:34 PM  Physician:  Dr. Guadlupe Spanish  8/4/20164:34 PM  Nursing:   Marcie Bal, RN 8/4/20164:34 PM  Clinical Social Worker: Ricardo, [place] 8/4/20164:34 PM  Counselor:   8/4/20164:34 PM  Other:  Everitt Amber, Luke 8/4/20164:34 PM  Other:   8/4/20164:34 PM  Other:   8/4/20164:34 PM  Other:  8/4/20164:34 PM  Other:  8/4/20164:34 PM  Other:  8/4/20164:34 PM  Other:  8/4/20164:34 PM  Other:  8/4/20164:34 PM  Other:  8/4/20164:34 PM  Other:   8/4/20164:34 PM   Scribe for Treatment Team:   Wray Kearns, 03/12/2015, 4:34 PM

## 2015-03-12 NOTE — Progress Notes (Signed)
Recreation Therapy Notes  Date: 08.04.16 Time: 3:00 pm Location: Craft Room  Group Topic: Self-expression  Goal Area(s) Addresses:  Patient will indentify one color per emotion listed on wheel. Patient will verbalize benefit of using art as a means of self-expression. Patient will verbalize one emotion experienced during session. Patient will be educated on other forms of self-expression.  Behavioral Response: Did not attend  Intervention: Emotion Wheel  Activity: Patients were given an Emotion Wheel with 7 different emotions. Patients were instructed to pick a color for each emotion and color it in.  Education: LRT educated patients on different forms of self-expression.  Education Outcome: Patient did not attend group.   Clinical Observations/Feedback: Patient did not attend group.  Jacquelynn Cree, LRT/CTRS 03/12/2015 3:48 PM

## 2015-03-12 NOTE — Progress Notes (Signed)
[facility] - Inpatient MD Progress Note  03/12/2015 2:17 PM [name]  MRN:  [id number]  Subjective:  Mr. Ethan Harvey still feels overwhelmed, hopeless, this respondent, sad, and didn't vaguely suicidal. He sees no reason for such her worsening of his depression. He did get better in the first to 3 days following admission but now feels completely overwhelmed. He denies any new problems in the community but there are no legal charges no conflict at home no relationship problems. He did lose his job he thinks the he kind of new from the beginning when he missed the first day of work. He is not concerned with it. He seems to tolerate medications well. Sleep and appetite are fair. He has been an eager participant in programming. He complains that with each group he gets very emotional and more depressed. He also complains of anxiety especially in the afternoon. He has been using his coping skills to no avail.  Principal Problem: Major depressive disorder, recurrent severe without psychotic features Diagnosis:   Patient Active Problem List   Diagnosis Date Noted  . Major depressive disorder, recurrent severe without psychotic features [F33.2] 03/07/2015   Total Time spent with patient: 30 minutes   Past Medical History:  Past Medical History  Diagnosis Date  . Scoliosis   . Asthma   . Thyroid disease Per mother  . Depression    History reviewed. No pertinent past surgical history. Family History: History reviewed. No pertinent family history. Social History:  History  Alcohol Use  . Yes    Comment: socially     History  Drug Use  . Yes  . Special: Cocaine, Marijuana    History   Social History  . Marital Status: Single    Spouse Name: N/A  . Number of Children: N/A  . Years of Education: N/A   Social History Main Topics  . Smoking status: Current Every Day Smoker -- 1.00 packs/day for 1 years    Types: Cigarettes  . Smokeless tobacco: Not on file  . Alcohol Use: Yes     Comment: socially   . Drug Use: Yes    Special: Cocaine, Marijuana  . Sexual Activity: Yes    Birth Control/ Protection: Condom   Other Topics Concern  . None   Social History Narrative   Additional History:    Sleep: Fair  Appetite:  Fair   Assessment:   Musculoskeletal: Strength & Muscle Tone: within normal limits Gait & Station: normal Patient leans: N/A   Psychiatric Specialty Exam: Physical Exam  Nursing note and vitals reviewed.   Review of Systems  All other systems reviewed and are negative.   Blood pressure 138/88, pulse 102, temperature 98.6 F (37 C), temperature source Oral, resp. rate 20, height 5\' 7"  (1.702 m), weight 72.576 kg (160 lb), SpO2 100 %.Body mass index is 25.05 kg/(m^2).  General Appearance: Casual  Eye Contact::  Poor  Speech:  Blocked  Volume:  Decreased  Mood:  Depressed, Hopeless and Worthless  Affect:  Blunt  Thought Process:  Linear  Orientation:  Full (Time, Place, and Person)  Thought Content:  WDL  Suicidal Thoughts:  Yes.  without intent/plan  Homicidal Thoughts:  No  Memory:  Immediate;   Fair Recent;   Fair Remote;   Fair  Judgement:  Fair  Insight:  Fair  Psychomotor Activity:  Decreased  Concentration:  Fair  Recall:  [employer] of Knowledge:Fair  Language: Fair  Akathisia:  No  Handed:  Right  AIMS (  if indicated):     Assets:  Communication Skills Desire for Improvement Financial Resources/Insurance Housing Intimacy Physical Health Resilience Social Support  ADL's:  Intact  Cognition: WNL  Sleep:  Number of Hours: 7     Current Medications: Current Facility-Administered Medications  Medication Dose Route Frequency Provider Last Rate Last Dose  . acetaminophen (TYLENOL) tablet 650 mg  650 mg Oral Q6H PRN Beau Fanny, MD   650 mg at 03/11/15 1513  . alum & mag hydroxide-simeth (MAALOX/MYLANTA) 200-200-20 MG/5ML suspension 30 mL  30 mL Oral Q4H PRN Beau Fanny, MD      . carbamazepine (TEGRETOL) tablet 200 mg  200  mg Oral BID Shari Prows, MD   200 mg at 03/12/15 0938  . folic acid (FOLVITE) tablet 1 mg  1 mg Oral Daily Kerin Salen, MD   1 mg at 03/12/15 2956  . hydrOXYzine (ATARAX/VISTARIL) tablet 25 mg  25 mg Oral TID Paddy Walthall B Carron Jaggi, MD      . magnesium citrate solution 1 Bottle  1 Bottle Oral Once Lisbeth Puller B Yaileen Hofferber, MD      . magnesium hydroxide (MILK OF MAGNESIA) suspension 30 mL  30 mL Oral Daily PRN Beau Fanny, MD   30 mL at 03/11/15 1307  . multivitamin with minerals tablet 1 tablet  1 tablet Oral Daily Kerin Salen, MD   1 tablet at 03/11/15 0902  . nicotine (NICOTROL) 10 MG inhaler 1 continuous puffing  1 continuous puffing Inhalation PRN Kerin Salen, MD   1 continuous puffing at 03/11/15 0902  . risperiDONE (RISPERDAL) tablet 2 mg  2 mg Oral QHS Andreal Vultaggio B Yardley Beltran, MD      . sertraline (ZOLOFT) tablet 50 mg  50 mg Oral Daily Jalina Blowers B Cameron Schwinn, MD      . traZODone (DESYREL) tablet 100 mg  100 mg Oral QHS Shari Prows, MD   100 mg at 03/11/15 2104    Lab Results: No results found for this or any previous visit (from the past 48 hour(s)).  Physical Findings: AIMS: Facial and Oral Movements Muscles of Facial Expression: None, normal Lips and Perioral Area: None, normal Jaw: None, normal Tongue: None, normal,Extremity Movements Upper (arms, wrists, hands, fingers): None, normal Lower (legs, knees, ankles, toes): None, normal, Trunk Movements Neck, shoulders, hips: None, normal, Overall Severity Severity of abnormal movements (highest score from questions above): None, normal Incapacitation due to abnormal movements: None, normal Patient's awareness of abnormal movements (rate only patient's report): No Awareness, Dental Status Current problems with teeth and/or dentures?: No Does patient usually wear dentures?: No  CIWA:  CIWA-Ar Total: 0 COWS:  COWS Total Score: 0  Treatment Plan Summary: Daily contact with patient to assess and evaluate  symptoms and progress in treatment and Medication management   Medical Decision Making:  Established Problem, Stable/Improving (1), Review of Psycho-Social Stressors (1), Review or order clinical lab tests (1), Review of Medication Regimen & Side Effects (2) and Review of New Medication or Change in Dosage (2)   Mr. Ethan Harvey is a 19 year old male with a history of untreated depression, mood instability, and substance use, admitted for suicidal ideation.  1. Suicidal ideation. The patient is suicidal again and unable to contract for safety in the community.   2. Depression We switched to Zoloft 50 mg daily for depression, continued Tegretol 200 mg twice daily for mood stabilization, and ended Risperdal 2 mg for further mood stabilization.  3. Alcohol detox. He completed CIWA  protocol. This was uncomplicated detox. Vital signs were stable.   4. Substance abuse treatment. The patient declines residential treatment. He will follow up with [facility].   5. Insomnia. We offered Trazodone.   6. Anxiety. We started Vistaril  25 mg 3 times daily.   7. Constipation. He will receive a dose of Mg citrate.   8. Smoking. Nicotine products are available.  9. Disposition. He was discharged to home with family. Follow up with [facility].      Julis Haubner 03/12/2015, 2:17 PM

## 2015-03-12 NOTE — Progress Notes (Signed)
D: Pt is awake and active in the milieu this evening. Pt mood is depressed and his affect is anxious. Pt is c/o racing thoughts and states it is causing him some distress.   A: Writer provided emotional support and administered medications as prescribed. Writer also discussed meditation as a means of coping with his anxiety and coached him in the practice.   A: Pt is participating in the milieu and is pleasant and cooperative with staff. Pt seems to be receptive to staff input and states that he will try new coping mechanisms. Pt went to bed following evening snack and medication administration.

## 2015-03-12 NOTE — Plan of Care (Signed)
Problem: Ineffective individual coping Goal: STG: Pt will be able to identify effective and ineffective STG: Pt will be able to identify effective and ineffective coping patterns  Outcome: Progressing Verbalizes that he is having difficulty dealing with his stressors.  States that he has tried meditation, reading, writing and drawing but it is not helping because he has difficult concentrating due to his mind racing.

## 2015-03-12 NOTE — Progress Notes (Signed)
Presents with a sad affect.  Denies SI but professes to having difficulty dealing with anxiety.  Medication and group compliant.  Complain of constipation, Dr. Jennet Maduro notified and orders received.  Safety maintained.

## 2015-03-12 NOTE — [facility] Group Notes (Signed)
[facility] LCSW Group Therapy  03/12/2015 4:06 PM  Type of Therapy:  Group Therapy  Participation Level:  Active  Participation Quality:  Attentive  Affect:  Appropriate  Cognitive:  Appropriate  Insight:  Developing/Improving  Engagement in Therapy:  Developing/Improving  Modes of Intervention:  Discussion, Education, Socialization and Support  Summary of Progress/Problems:  Cheron Schaumann 03/12/2015, 4:06 PM

## 2015-03-12 NOTE — [facility] Group Notes (Signed)
[facility] Group Notes:  (Nursing/MHT/Case Management/Adjunct)  Date:  03/12/2015  Time:  1:38 PM  Type of Therapy:  Movement Therapy  Participation Level:  Active  Participation Quality:  Appropriate  Affect:  Appropriate  Cognitive:  Alert, Appropriate and Oriented  Insight:  Appropriate  Engagement in Group:  None  Modes of Intervention:  Activity  Summary of Progress/Problems:  Ethan Harvey 03/12/2015, 1:38 PM

## 2015-03-13 DIAGNOSIS — F314 Bipolar disorder, current episode depressed, severe, without psychotic features: Secondary | ICD-10-CM | POA: Diagnosis present

## 2015-03-13 DIAGNOSIS — E039 Hypothyroidism, unspecified: Secondary | ICD-10-CM | POA: Diagnosis present

## 2015-03-13 DIAGNOSIS — F1010 Alcohol abuse, uncomplicated: Secondary | ICD-10-CM | POA: Diagnosis present

## 2015-03-13 DIAGNOSIS — F332 Major depressive disorder, recurrent severe without psychotic features: Secondary | ICD-10-CM | POA: Insufficient documentation

## 2015-03-13 DIAGNOSIS — F17200 Nicotine dependence, unspecified, uncomplicated: Secondary | ICD-10-CM | POA: Diagnosis present

## 2015-03-13 DIAGNOSIS — F1220 Cannabis dependence, uncomplicated: Secondary | ICD-10-CM | POA: Diagnosis present

## 2015-03-13 DIAGNOSIS — F319 Bipolar disorder, unspecified: Secondary | ICD-10-CM | POA: Insufficient documentation

## 2015-03-13 DIAGNOSIS — F1410 Cocaine abuse, uncomplicated: Secondary | ICD-10-CM | POA: Diagnosis present

## 2015-03-13 MED ORDER — SERTRALINE HCL 50 MG PO TABS: 50.0000 mg | ORAL_TABLET | Freq: Every day | ORAL | Status: DC

## 2015-03-13 MED ORDER — HYDROXYZINE HCL 25 MG PO TABS: 25.0000 mg | ORAL_TABLET | Freq: Three times a day (TID) | ORAL | Status: DC

## 2015-03-13 MED ORDER — CARBAMAZEPINE 200 MG PO TABS: 200.0000 mg | ORAL_TABLET | Freq: Two times a day (BID) | ORAL | Status: DC

## 2015-03-13 MED ORDER — TRAZODONE HCL 100 MG PO TABS: 100.0000 mg | ORAL_TABLET | Freq: Every day | ORAL | Status: DC

## 2015-03-13 MED ORDER — RISPERIDONE 2 MG PO TABS: 2.0000 mg | ORAL_TABLET | Freq: Every day | ORAL | Status: DC

## 2015-03-13 NOTE — Discharge Summary (Signed)
Physician Discharge Summary Note  Patient:  Ethan Harvey is an 19 y.o., male MRN:  [id number] DOB:  Jan 04, 1996 Patient phone:  [phone] (home)  Patient address:   [address] [place] [address],  Total Time spent with patient: 30 minutes  Date of Admission:  03/07/2015 Date of Discharge: 03/13/2015  Reason for Admission:  Suicidal ideation.  History of Present Illness: Patient indicated that he's been depressed since middle school however he states over the past year it is worsened. He states the things that he used to do to help his depression have stopped working. He discussed that he used to do things like cut and do substances that will be discussed further below. He states that he has been using Wii daily and uses a quarter of an ounce a day. He states he's been using alcohol daily. He states that during the week Monday through Thursday he might consume 1-240 ounce beers. He states on the weekend days he will take 2-340 ounces on each weekend day. He states cocaine use he uses approximately 3 days a week and will use "as much as I can get." He states typically this will range anywhere from 1 g to 6 g a day. He states he also uses hallucinogens that include shrooms, acid and DMT. He states that he has been using marijuana since middle school. He states cocaine use as occurred over the past 2 years. He states that the hallucinogen use has been for the past 3 or 4 years. He states his longest period of sobriety from any of these substances since their onset of use has been one week. He states the reason he was abstinent was he simply cannot get anything.  He endorses depressive symptoms of irregular sleep. However he states that he can go 3-4 days without sleeping and then he'll sleep a day. He endorses anhedonia, low energy, poor appetite although he states his appetite has never been good. Currently he has suicidal ideation. He denies symptoms of mania. He denies auditory or visual  hallucinations that are clear. He states occasional get a sense that he seeing a shadow but he'll turn and will cannot see anything that is well-formed. In regards to auditory hallucinations he states sometimes he might think someone's calling him but he'll realize that is not the case. He denies any clear conversations or commands. Elements: Duration: As noted above. Associated Signs/Symptoms: Depression Symptoms: depressed mood, anhedonia, insomnia, fatigue, suicidal thoughts without plan, loss of energy/fatigue, disturbed sleep, decreased appetite, (Hypo) Manic Symptoms: Denied Anxiety Symptoms: Denied any Psychotic Symptoms: None PTSD Symptoms: Negative  Principal Problem: Bipolar disorder, current episode depressed, severe, without psychotic features Discharge Diagnoses: Patient Active Problem List   Diagnosis Date Noted  . Bipolar disorder, current episode depressed, severe, without psychotic features [F31.4] 03/13/2015  . Cocaine use disorder, mild, abuse [F14.10] 03/13/2015  . Cannabis use disorder, moderate, dependence [F12.20] 03/13/2015  . Tobacco use disorder [Z72.0] 03/13/2015  . Hypothyroidism [E03.9] 03/13/2015    Musculoskeletal: Strength & Muscle Tone: within normal limits Gait & Station: normal Patient leans: N/A  Psychiatric Specialty Exam: Physical Exam  Nursing note and vitals reviewed.   Review of Systems  All other systems reviewed and are negative.   Blood pressure 126/83, pulse 125, temperature 98.2 F (36.8 C), temperature source Oral, resp. rate 20, height 5\' 7"  (1.702 m), weight 72.576 kg (160 lb), SpO2 100 %.Body mass index is 25.05 kg/(m^2).  See SRA.  Sleep:  Number of Hours: 7.75   Have you used any form of tobacco in the last 30 days? (Cigarettes, Smokeless Tobacco, Cigars, and/or Pipes): Yes  Has this patient used any form of tobacco in the last 30 days?  (Cigarettes, Smokeless Tobacco, Cigars, and/or Pipes) Yes, A prescription for an FDA-approved tobacco cessation medication was offered at discharge and the patient refused  Past Medical History:  Past Medical History  Diagnosis Date  . Scoliosis   . Asthma   . Thyroid disease Per mother  . Depression    History reviewed. No pertinent past surgical history. Family History: History reviewed. No pertinent family history. Social History:  History  Alcohol Use  . Yes    Comment: socially     History  Drug Use  . Yes  . Special: Cocaine, Marijuana    History   Social History  . Marital Status: Single    Spouse Name: N/A  . Number of Children: N/A  . Years of Education: N/A   Social History Main Topics  . Smoking status: Current Every Day Smoker -- 1.00 packs/day for 1 years    Types: Cigarettes  . Smokeless tobacco: Not on file  . Alcohol Use: Yes     Comment: socially  . Drug Use: Yes    Special: Cocaine, Marijuana  . Sexual Activity: Yes    Birth Control/ Protection: Condom   Other Topics Concern  . None   Social History Narrative    Past Psychiatric History: Hospitalizations:  Outpatient Care:  Substance Abuse Care:  Self-Mutilation:  Suicidal Attempts:  Violent Behaviors:   Risk to Self: Is patient at risk for suicide?: Yes (pt contracts for safety) What has been your use of drugs/alcohol within the last 12 months?: marajuana, alcoholl  Risk to Others:   Prior Inpatient Therapy:   Prior Outpatient Therapy:    Level of Care:  OP  Hospital Course:    Mr. Derrell Lolling is a 19 year old male with a history of untreated depression, mood instability, and substance use, admitted for suicidal ideation.  1. Suicidal ideation. This has resolved.The patient is able to contract for safety in the community.   2. Mood. He will continue Zoloft 50 mg for depression, Tegretol 200 mg twice daily and Risperdal 2 mg at bedtime for mood stabilization.  3. Alcohol detox. He  completed CIWA protocol. This was uncomplicated detox. Vital signs were stable.   4. Substance abuse treatment. The patient declines residential treatment. He will follow up with [facility].   5. Insomnia. Responded well to trazodone.  6. Anxiety. We started Vistaril 25 mg 3 times daily.   7. Constipation. He receiveda dose of Mg citrate.   8. Smoking. Nicotine products were available.  9. Disposition. He was discharged to home with family. Follow up with [facility].    Consults:  None  Significant Diagnostic Studies:  None  Discharge Vitals:   Blood pressure 126/83, pulse 125, temperature 98.2 F (36.8 C), temperature source Oral, resp. rate 20, height  (1.702 m), weight 72.576 kg (160 lb), SpO2 100 %. Body mass index is 25.05 kg/(m^2). Lab Results:   No results found for this or any previous visit (from the past 72 hour(s)).  Physical Findings: AIMS: Facial and Oral Movements Muscles of Facial Expression: None, normal Lips and Perioral Area: None, normal Jaw: None, normal Tongue: None, normal,Extremity Movements Upper (arms, wrists, hands, fingers): None, normal Lower (legs, knees, ankles, toes): None, normal, Trunk Movements Neck, shoulders, hips:  None, normal, Overall Severity Severity of abnormal movements (highest score from questions above): None, normal Incapacitation due to abnormal movements: None, normal Patient's awareness of abnormal movements (rate only patient's report): No Awareness, Dental Status Current problems with teeth and/or dentures?: No Does patient usually wear dentures?: No  CIWA:  CIWA-Ar Total: 0 COWS:  COWS Total Score: 0   See Psychiatric Specialty Exam and Suicide Risk Assessment completed by Attending Physician prior to discharge.  Discharge destination:  Home  Is patient on multiple antipsychotic therapies at discharge:  No   Has Patient had three or more failed trials of antipsychotic monotherapy by history:   No    Recommended Plan for Multiple Antipsychotic Therapies: NA  Discharge Instructions    Diet - low sodium heart healthy    Complete by:  As directed      Diet - low sodium heart healthy    Complete by:  As directed      Increase activity slowly    Complete by:  As directed      Increase activity slowly    Complete by:  As directed             Medication List    TAKE these medications      Indication   albuterol 108 (90 BASE) MCG/ACT inhaler  Commonly known as:  PROVENTIL HFA;VENTOLIN HFA  Inhale 2 puffs into the lungs every 6 (six) hours as needed for wheezing or shortness of breath.      carbamazepine 200 MG tablet  Commonly known as:  TEGRETOL  Take 1 tablet (200 mg total) by mouth 2 (two) times daily.   Indication:  Manic-Depression     cetirizine 10 MG tablet  Commonly known as:  ZYRTEC  Take 10 mg by mouth daily.      hydrOXYzine 25 MG tablet  Commonly known as:  ATARAX/VISTARIL  Take 1 tablet (25 mg total) by mouth 3 (three) times daily.   Indication:  Anxiety Neurosis     levothyroxine 25 MCG tablet  Commonly known as:  SYNTHROID, LEVOTHROID  Take 25 mcg by mouth daily.      risperiDONE 2 MG tablet  Commonly known as:  RISPERDAL  Take 1 tablet (2 mg total) by mouth at bedtime.   Indication:  Manic-Depression     sertraline 50 MG tablet  Commonly known as:  ZOLOFT  Take 1 tablet (50 mg total) by mouth daily.   Indication:  Major Depressive Disorder     traZODone 100 MG tablet  Commonly known as:  DESYREL  Take 1 tablet (100 mg total) by mouth at bedtime.   Indication:  Trouble Sleeping           Follow-up Information    Follow up with [facility]. Go on 03/16/2015.   Why:  10:30am for , Hospital Follow up, Outpatient Medication Management, Therapy, Please take your photo ID and Insurance card, Please reschedule if unable to make appointment.   Contact information:   [address] [place]  [address] [phone] [phone]      Follow-up recommendations:  Activity:  as tolerated. Diet:  Regular. Other:  Keep follow-up appointments.  Comments:    Total Discharge Time: 35 min.  Signed: Kristine Linea 03/13/2015, 3:33 PM

## 2015-03-13 NOTE — BHH Counselor (Signed)
Adult Comprehensive Assessment  Patient ID: Ethan Harvey, male DOB: 09-12-1995, 19 y.o. MRN: [id number]  Information Source:    Current Stressors:  Employment / Job issues: may have lost his job, hasn't been there in 3 days and has not called anyone Family Relationships: mother and girlfriend Social relationships: minimal support other than mother and girlfriend Substance abuse: yes, marajuana, alcohol, pills, has used acid, mushrooms, etc, -has tried "anything"  Living/Environment/Situation:  Living Arrangements: Parent Living conditions (as described by patient or guardian): comfortable How long has patient lived in current situation?: whole life What is atmosphere in current home: Comfortable, Supportive, Loving  Family History:  Marital status: Single Does patient have children?: No  Childhood History:  By whom was/is the patient raised?: Mother Description of patient's relationship with caregiver when they were a child: good, has not been telling her what's been going on with him Patient's description of current relationship with people who raised him/her: good relationship  Education:  Highest grade of school patient has completed: 12th grade Currently a student?: No Name of school: N/A Learning disability?: No  Employment/Work Situation:  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 Has patient ever been in the [employer]?: No Has patient ever served in combat?: No  Financial Resources:  Financial resources: Income from employment, Support from parents / caregiver Does patient have a representative payee or guardian?: No  Alcohol/Substance Abuse:  What has been your use of drugs/alcohol within the last  12 months?: marajuana, alcoholl  Alcohol/Substance Abuse Treatment Hx: Denies past history If yes, describe treatment: N/A  Social Support System:  Patient's Community Support System: Fair Museum/gallery exhibitions officer System: mother and girlfriend  Leisure/Recreation:  Leisure and Hobbies: has a Nurse, mental health, walks her and cares for her, spending time with grilfriend  Strengths/Needs:  In what areas does patient struggle / problems for patient: has been isolating self, more emotional verbalizes "breaking down" becoming tearful in front of girlfriend who encouraged him to come to the hospital  Discharge Plan:  Currently receiving community mental health services: No If no, would patient like referral for services when discharged?: Yes (What county?) (Pt lives in [place], either [facility] or [place]) Does patient have financial barriers related to discharge medications?: No  Summary/Recommendations: Ethan Harvey is a 19 year old male who presented to [facility] with substance abuse and SI. He reports using marijuana and alcohol but has experimented with many other substances. Pt lives with his mother in [place]. He believes his depression is the underlining cause of his substance abuse. He does not want treatment for substance abuse. He does not currently connected to a provider but wants a referral. He is currently employed but is concerned he may lose is job. Pt plans to return home and follow up with outpatient. Recommendations include; crisis stabilization, medication management, therapeutic milieu, and encourage group attendance and participation.  Ethan Harvey MSW, [place]  03/13/2015

## 2015-03-13 NOTE — BHH Group Notes (Signed)
BHH Group Notes:  (Nursing/MHT/Case Management/Adjunct)  Date:  03/13/2015  Time:  4:41 PM  Type of Therapy:  Group Therapy  Participation Level:  Active  Participation Quality:  Appropriate, Attentive, Sharing and Supportive  Affect:  Appropriate  Cognitive:  Alert, Appropriate and Oriented  Insight:  Appropriate  Engagement in Group:  Engaged  Modes of Intervention:  Discussion and Education  Summary of Progress/Problems:  Ethan Harvey De'Chelle Lelan Cush 03/13/2015, 4:41 PM

## 2015-03-13 NOTE — Progress Notes (Signed)
D: Patient denies SI/HI/AVH. Patient affect is flat and his mood is depressed.  Patient did attend evening group. Patient visible on the milieu. No distress noted. A: Support and encouragement offered. Scheduled medications given to pt. Q 15 min checks continued for patient safety. R: Patient receptive. Patient remains safe on the unit.   

## 2015-03-13 NOTE — BHH Suicide Risk Assessment (Signed)
[facility] Discharge Suicide Risk Assessment   Demographic Factors:  Male, Adolescent or young adult, Caucasian and Low socioeconomic status  Total Time spent with patient: 30 minutes  Musculoskeletal: Strength & Muscle Tone: within normal limits Gait & Station: normal Patient leans: N/A  Psychiatric Specialty Exam: Physical Exam  Nursing note and vitals reviewed.   Review of Systems  All other systems reviewed and are negative.   Blood pressure 126/83, pulse 125, temperature 98.2 F (36.8 C), temperature source Oral, resp. rate 20, height 5\' 7"  (1.702 m), weight 72.576 kg (160 lb), SpO2 100 %.Body mass index is 25.05 kg/(m^2).  General Appearance: Casual  Eye Contact::  Good  Speech:  Clear and Coherent409  Volume:  Normal  Mood:  Euthymic  Affect:  Appropriate  Thought Process:  Goal Directed  Orientation:  Full (Time, Place, and Person)  Thought Content:  WDL  Suicidal Thoughts:  No  Homicidal Thoughts:  No  Memory:  Immediate;   Fair Recent;   Fair Remote;   Fair  Judgement:  Fair  Insight:  Fair  Psychomotor Activity:  Normal  Concentration:  Fair  Recall:  [employer] of Knowledge:Fair  Language: Fair  Akathisia:  No  Handed:  Right  AIMS (if indicated):     Assets:  Communication Skills Desire for Improvement Financial Resources/Insurance Housing Intimacy Physical Health Resilience Social Support  Sleep:  Number of Hours: 7.75  Cognition: WNL  ADL's:  Intact   Have you used any form of tobacco in the last 30 days? (Cigarettes, Smokeless Tobacco, Cigars, and/or Pipes): Yes  Has this patient used any form of tobacco in the last 30 days? (Cigarettes, Smokeless Tobacco, Cigars, and/or Pipes) Yes, A prescription for an FDA-approved tobacco cessation medication was offered at discharge and the patient refused  Mental Status Per Nursing Assessment::   On Admission:     Current Mental Status by Physician: NA  Loss Factors: NA  Historical  Factors: Impulsivity  Risk Reduction Factors:   Sense of responsibility to family, Employed, Living with another person, especially a relative and Positive social support  Continued Clinical Symptoms:  Bipolar Disorder:   Depressive phase Alcohol/Substance Abuse/Dependencies  Cognitive Features That Contribute To Risk:  None    Suicide Risk:  Minimal: No identifiable suicidal ideation.  Patients presenting with no risk factors but with morbid ruminations; may be classified as minimal risk based on the severity of the depressive symptoms  Principal Problem: Bipolar disorder, current episode depressed, severe, without psychotic features Discharge Diagnoses:  Patient Active Problem List   Diagnosis Date Noted  . Bipolar disorder, current episode depressed, severe, without psychotic features [F31.4] 03/13/2015  . Cocaine use disorder, mild, abuse [F14.10] 03/13/2015  . Cannabis use disorder, moderate, dependence [F12.20] 03/13/2015  . Tobacco use disorder [Z72.0] 03/13/2015  . Hypothyroidism [E03.9] 03/13/2015    Follow-up Information    Follow up with [facility]. Go on 03/16/2015.   Why:  10:30am for , Hospital Follow up, Outpatient Medication Management, Therapy, Please take your photo ID and Insurance card, Please reschedule if unable to make appointment.   Contact information:   [address] [place] [address] [phone] [phone]      Plan Of Care/Follow-up recommendations:  Activity:  as tolerated. Diet:  regular. Other:  keep follow-up appointments.  Is patient on multiple antipsychotic therapies at discharge:  No   Has Patient had three or more failed trials of antipsychotic monotherapy by history:  No  Recommended  Plan for Multiple Antipsychotic Therapies: NA    Ardyn Forge 03/13/2015, 3:30 PM

## 2015-03-13 NOTE — Progress Notes (Signed)
Patient has been pleasant and cooperative.  Medication and group compliant.  Visible in milieu.  Interacting appropriately with peers and staff.  Discharge instructions given, verbalized understanding. Prescriptions given and personal belongings returned.  Escorted off unit by this Clinical research associate to meet friend to travel home.

## 2015-03-13 NOTE — BHH Group Notes (Signed)
B[facility] Group Notes:  (Nursing/MHT/Case Management/Adjunct)  Date:  03/13/2015  Time:  1:57 AM  Type of Therapy:  Group Therapy  Participation Level:  Active  Participation Quality:  Appropriate and Attentive  Affect:  Appropriate  Cognitive:  Appropriate  Insight:  Appropriate  Engagement in Group:  Engaged  Modes of Intervention:  Discussion  Summary of Progress/Problems:  Ethan Harvey Joy Max Nuno 03/13/2015, 1:57 AM

## 2015-03-13 NOTE — Progress Notes (Signed)
  [facility] Adult Case Management Discharge Plan :  Will you be returning to the same living situation after discharge:  Yes,  home At discharge, do you have transportation home?: Yes,  family  Do you have the ability to pay for your medications: Yes,  Insurance   Release of information consent forms completed and in the chart;  Patient's signature needed at discharge.  Patient to Follow up at: Follow-up Information    Follow up with [facility]. Go on 03/16/2015.   Why:  10:30am for , Hospital Follow up, Outpatient Medication Management, Therapy, Please take your photo ID and Insurance card, Please reschedule if unable to make appointment.   Contact information:   [address] [place] [address] [phone] [phone]      Patient denies SI/HI: Yes,  Yes     Safety Planning and Suicide Prevention discussed: Yes,  With patient.   Have you used any form of tobacco in the last 30 days? (Cigarettes, Smokeless Tobacco, Cigars, and/or Pipes): Yes  Has patient been referred to the Quitline?: Patient refused referral  Daisy Floro Alfreida Steffenhagen MSW, LCSWA  03/13/2015, 3:13 PM

## 2015-03-13 NOTE — Progress Notes (Signed)
AVS H&P Discharge Summary faxed to [facility] for hospital follow-up

## 2015-03-13 NOTE — BHH Group Notes (Signed)
[facility] LCSW Aftercare Discharge Planning Group Note   03/13/2015 12:57 PM  Participation Quality:  Minimal   Mood/Affect:  Depressed  Depression Rating:  4  Anxiety Rating:  4  Thoughts of Suicide:  No Will you contract for safety?   NA  Current AVH:  NA  Plan for Discharge/Comments:  Pt plans to return home and follow up with outpatient. He states he feels "ok" today.   Transportation Means: family   Supports:Family, girlfriend   Garment/textile technologist MSW, [address]

## 2015-03-13 NOTE — BHH Suicide Risk Assessment (Signed)
BHH INPATIENT:  Family/Significant Other Suicide Prevention Education  Suicide Prevention Education:  Contact Attempts: Lavone Nian (mother) [phone] has been identified by the patient as the family member/significant other with whom the patient will be residing, and identified as the person(s) who will aid the patient in the event of a mental health crisis.  With written consent from the patient, two attempts were made to provide suicide prevention education, prior to and/or following the patient's discharge.  We were unsuccessful in providing suicide prevention education.  A suicide education pamphlet was given to the patient to share with family/significant other.   SPE completed with pt and he was encouraged to share information provided in SPI pamphlet with support network, ask questions, and talk about any concerns relating to SPE. Pt denies SI/HI/AVH and verbalized understanding of information presented.    Date and time of first attempt:03/13/2015 11:00am  Date and time of second attempt:03/13/2015 3:12 PM   Allee Busk L Khiya Friese MSW, LCSWA  03/13/2015, 3:11 PM

## 2015-03-13 NOTE — Progress Notes (Signed)
Recreation Therapy Notes  Date: 08.05.16 Time: 3:00 pm Location: Craft Room  Group Topic: Coping Skills  Goal Area(s) Addresses:  Patient will verbalize an emotion they experienced during group. Patient will verbalize benefit of art.  Behavioral Response: Attentive  Intervention: [place]andalas  Activity: Patients were given mandalas to color.  Education: LRT educated patients on the benefits of art.  Education Outcome: Acknowledges education/In group clarification offered  Clinical Observations/Feedback: Patient colored mandala. Patient left group at 3:25 pm and returned at 3:27 pm. Patient did not contribute to group discussion.  Jacquelynn Cree, LRT/CTRS 03/13/2015 4:42 PM

## 2015-03-13 NOTE — Plan of Care (Signed)
Problem: Alteration in mood; excessive anxiety as evidenced by: Goal: LTG-Patient's behavior demonstrates decreased anxiety (Patient's behavior demonstrates anxiety and he/she is utilizing learned coping skills to deal with anxiety-producing situations)  Outcome: Not Progressing Client presented today with anxiety relating to having somatic complaint of being constipated. Client had received medication the day before and client stated, "I got the liquid dynamite; it didn't help." Encouraged fluids.

## 2016-01-24 IMAGING — DX DG CHEST 2V
2 series · 2 of 2 positions shown · non-contrast
Comparison: None.

CLINICAL DATA: Headache, nausea, syncopal episode. Occasional
smoker.

EXAM:
CHEST  2 VIEW

[chest pa]
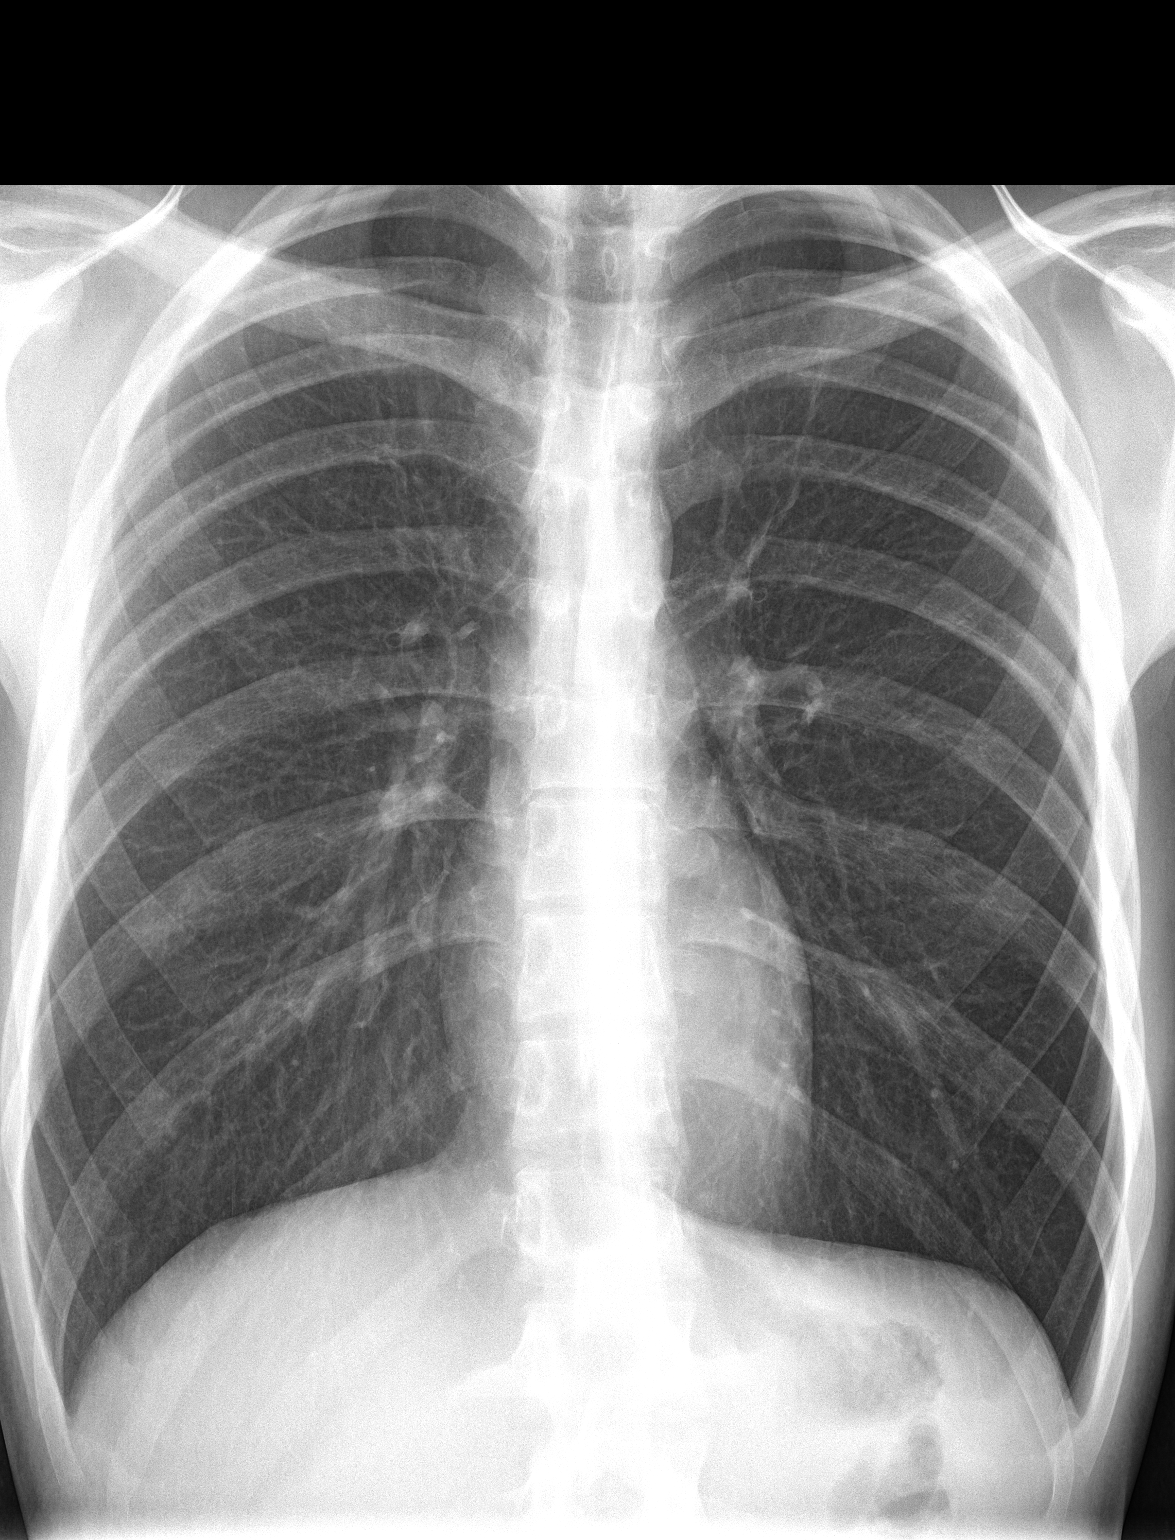

[chest lat]
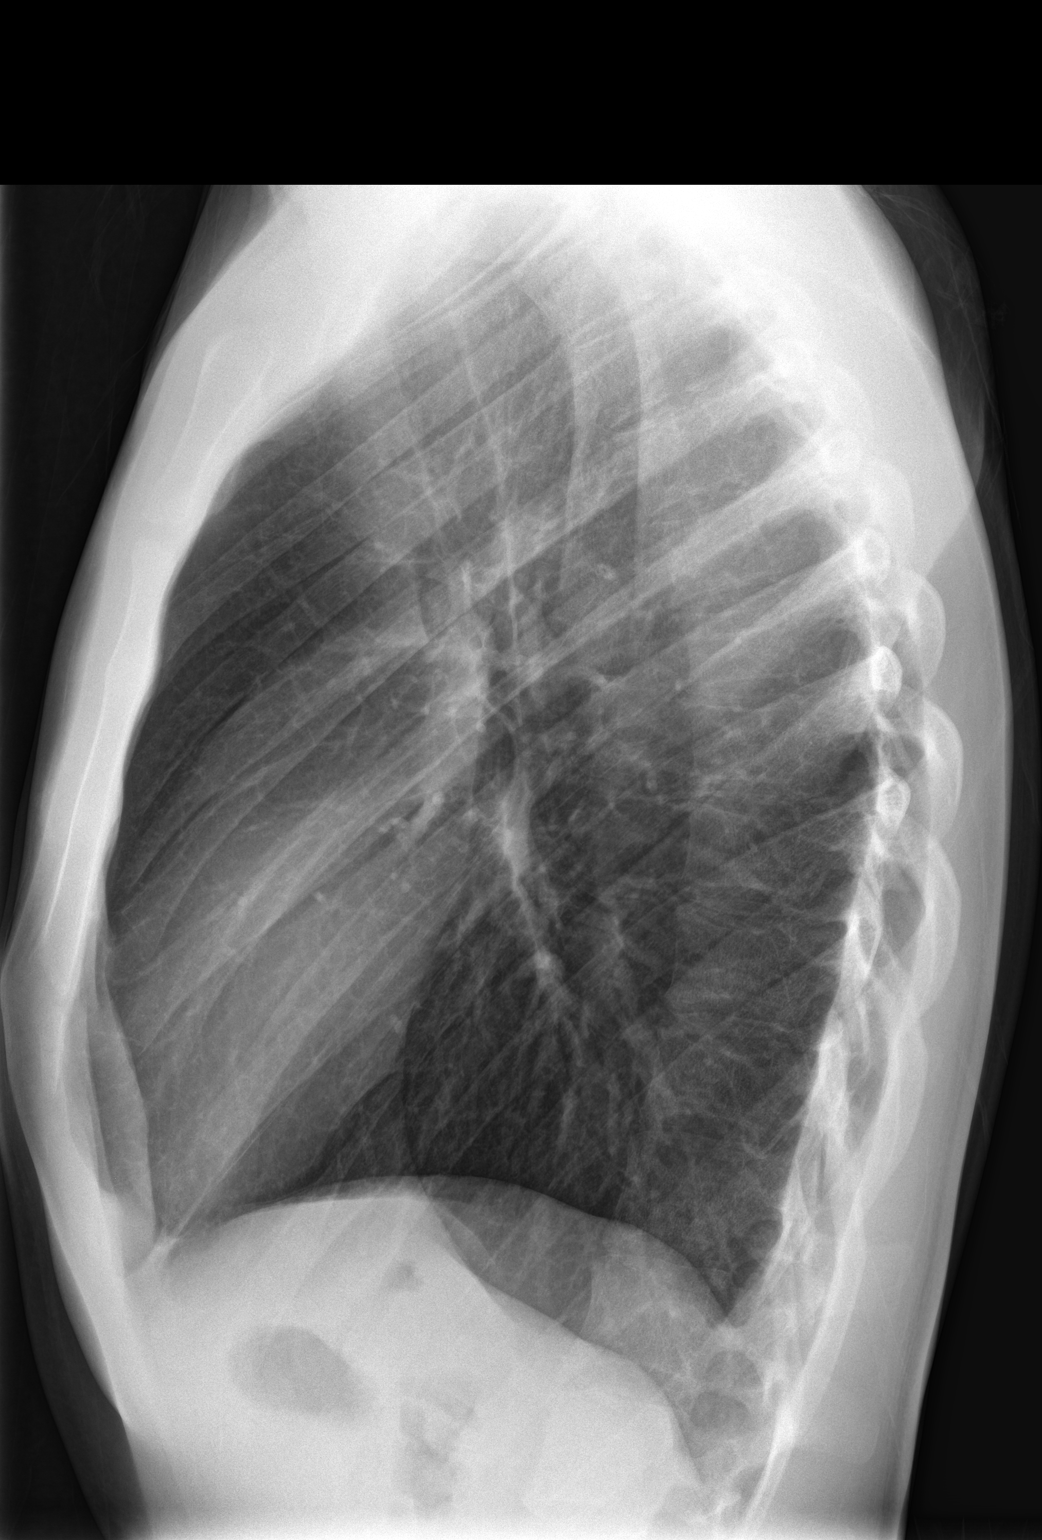

[2 of 2 positions shown; findings below may reference images not displayed]

FINDINGS: Normal cardiac silhouette and mediastinal contours. Excellent
inspiratory effort. No focal airspace opacities. No pleural effusion
or pneumothorax. No evidence of edema. No acute osseus
abnormalities.
IMPRESSION: No acute cardiopulmonary disease.

## 2021-06-07 ENCOUNTER — Other Ambulatory Visit: Payer: Self-pay

## 2021-06-07 DIAGNOSIS — Z0283 Encounter for blood-alcohol and blood-drug test: Secondary | ICD-10-CM

## 2021-06-07 NOTE — Progress Notes (Signed)
Presents to [employer] for on-site pre-employment drug screen.  [facility] Acct # [id number] [employer] Specimen # [id number]  Rapid Drug Screen Results = Negative  AMD

## 2021-06-22 ENCOUNTER — Telehealth: Payer: Self-pay

## 2021-06-22 NOTE — Telephone Encounter (Signed)
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  Email says he thinks he's had the Hep B vaccine in school & doesn't want to get it if he's already had it.  Tried to call Ethan Harvey & it went straight to voice mail.  Left call back message.  AMD

## 2021-06-24 ENCOUNTER — Other Ambulatory Visit: Payer: Self-pay

## 2021-06-24 DIAGNOSIS — Z0184 Encounter for antibody response examination: Secondary | ICD-10-CM

## 2021-06-24 NOTE — Progress Notes (Signed)
Presents to [facility] for Hepatitis B Titer.  Works as Retail buyer at [employer].  AMD

## 2021-06-25 LAB — HEPATITIS B SURFACE ANTIBODY,QUALITATIVE: Hep B Surface Ab, Qual: NONREACTIVE

## 2021-07-14 ENCOUNTER — Telehealth: Payer: Self-pay

## 2021-07-14 NOTE — Telephone Encounter (Signed)
Tried to call [facility] to review Hep B Titer results.  Went straight to voice mail.  Left a call back message.  Sent message to [facility] through [employer].  AMD

## 2022-01-19 DIAGNOSIS — Z0000 Encounter for general adult medical examination without abnormal findings: Secondary | ICD-10-CM

## 2022-01-24 ENCOUNTER — Encounter: Payer: Self-pay | Admitting: Physician Assistant

## 2022-06-02 ENCOUNTER — Emergency Department
Admission: EM | Admit: 2022-06-02 | Discharge: 2022-06-02 | Disposition: A | Discharge status: Home / Self Care | Payer: No Typology Code available for payment source | Attending: Emergency Medicine | Admitting: Emergency Medicine

## 2022-06-02 ENCOUNTER — Encounter: Payer: Self-pay | Admitting: Emergency Medicine

## 2022-06-02 ENCOUNTER — Other Ambulatory Visit: Payer: Self-pay

## 2022-06-02 ENCOUNTER — Emergency Department: Payer: No Typology Code available for payment source

## 2022-06-02 DIAGNOSIS — S7011XA Contusion of right thigh, initial encounter: Secondary | ICD-10-CM | POA: Diagnosis not present

## 2022-06-02 DIAGNOSIS — M549 Dorsalgia, unspecified: Secondary | ICD-10-CM | POA: Diagnosis not present

## 2022-06-02 DIAGNOSIS — F17200 Nicotine dependence, unspecified, uncomplicated: Secondary | ICD-10-CM | POA: Diagnosis not present

## 2022-06-02 DIAGNOSIS — Y92410 Unspecified street and highway as the place of occurrence of the external cause: Secondary | ICD-10-CM | POA: Diagnosis not present

## 2022-06-02 DIAGNOSIS — J45909 Unspecified asthma, uncomplicated: Secondary | ICD-10-CM | POA: Diagnosis not present

## 2022-06-02 DIAGNOSIS — M25561 Pain in right knee: Secondary | ICD-10-CM | POA: Diagnosis not present

## 2022-06-02 DIAGNOSIS — S8991XA Unspecified injury of right lower leg, initial encounter: Secondary | ICD-10-CM

## 2022-06-02 DIAGNOSIS — S79921A Unspecified injury of right thigh, initial encounter: Secondary | ICD-10-CM | POA: Diagnosis present

## 2022-06-02 MED ORDER — MELOXICAM 15 MG PO TABS: 15.0000 mg | ORAL_TABLET | Freq: Every day | ORAL | 0 refills | Status: AC

## 2022-06-02 MED ORDER — CYCLOBENZAPRINE HCL 10 MG PO TABS: 10.0000 mg | ORAL_TABLET | Freq: Three times a day (TID) | ORAL | 0 refills | Status: AC | PRN
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
## 2022-06-02 NOTE — Discharge Instructions (Addendum)
-  You may take the meloxicam as needed for pain/discomfort.  You may additionally take Tylenol on top of this as well.  You may take the cyclobenzaprine, though use caution as it may make you dizzy/drowsy.  -You may use the knee brace as needed when walking.  -Please follow-up with the orthopedist listed if your knee pain persist after a few weeks.  Please let them know that you were seen here in the emergency department.  -Return to the emergency department anytime if you begin to experience any new or worsening symptoms.
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
## 2022-06-17 DIAGNOSIS — M79641 Pain in right hand: Secondary | ICD-10-CM | POA: Diagnosis not present

## 2022-06-17 DIAGNOSIS — F10129 Alcohol abuse with intoxication, unspecified: Secondary | ICD-10-CM | POA: Diagnosis not present

## 2022-06-17 DIAGNOSIS — R10817 Generalized abdominal tenderness: Secondary | ICD-10-CM | POA: Diagnosis not present

## 2022-06-17 DIAGNOSIS — S32421A Displaced fracture of posterior wall of right acetabulum, initial encounter for closed fracture: Secondary | ICD-10-CM | POA: Diagnosis not present

## 2022-06-17 DIAGNOSIS — R69 Illness, unspecified: Secondary | ICD-10-CM | POA: Diagnosis not present

## 2022-06-17 DIAGNOSIS — M545 Low back pain, unspecified: Secondary | ICD-10-CM | POA: Diagnosis not present

## 2022-06-17 DIAGNOSIS — S81011A Laceration without foreign body, right knee, initial encounter: Secondary | ICD-10-CM | POA: Diagnosis not present

## 2022-06-21 DIAGNOSIS — S32424A Nondisplaced fracture of posterior wall of right acetabulum, initial encounter for closed fracture: Secondary | ICD-10-CM | POA: Diagnosis not present

## 2022-06-29 DIAGNOSIS — S32424A Nondisplaced fracture of posterior wall of right acetabulum, initial encounter for closed fracture: Secondary | ICD-10-CM | POA: Diagnosis not present

## 2022-06-29 DIAGNOSIS — S79911A Unspecified injury of right hip, initial encounter: Secondary | ICD-10-CM | POA: Diagnosis not present

## 2022-07-04 ENCOUNTER — Encounter: Payer: Self-pay | Admitting: Physician Assistant

## 2022-07-04 ENCOUNTER — Ambulatory Visit: Payer: Self-pay | Admitting: Physician Assistant

## 2022-07-04 VITALS — Resp 14 | Ht 69.0 in | Wt 190.0 lb

## 2022-07-04 DIAGNOSIS — Z8781 Personal history of (healed) traumatic fracture: Secondary | ICD-10-CM
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
## 2022-07-27 DIAGNOSIS — Z882 Allergy status to sulfonamides status: Secondary | ICD-10-CM | POA: Diagnosis not present

## 2022-07-27 DIAGNOSIS — S32424D Nondisplaced fracture of posterior wall of right acetabulum, subsequent encounter for fracture with routine healing: Secondary | ICD-10-CM | POA: Diagnosis not present

## 2022-07-27 DIAGNOSIS — S32424A Nondisplaced fracture of posterior wall of right acetabulum, initial encounter for closed fracture: Secondary | ICD-10-CM | POA: Diagnosis not present

## 2022-07-27 DIAGNOSIS — M1611 Unilateral primary osteoarthritis, right hip: Secondary | ICD-10-CM | POA: Diagnosis not present

## 2023-01-25 ENCOUNTER — Ambulatory Visit: Payer: Self-pay

## 2023-03-20 DIAGNOSIS — Z23 Encounter for immunization: Secondary | ICD-10-CM | POA: Diagnosis not present

## 2023-06-16 DIAGNOSIS — F1099 Alcohol use, unspecified with unspecified alcohol-induced disorder: Secondary | ICD-10-CM | POA: Diagnosis not present

## 2024-01-19 ENCOUNTER — Ambulatory Visit: Payer: Self-pay

## 2024-01-19 DIAGNOSIS — Z0000 Encounter for general adult medical examination without abnormal findings: Secondary | ICD-10-CM

## 2024-01-19 LAB — POCT URINALYSIS DIPSTICK
Bilirubin, UA: NEGATIVE
Blood, UA: NEGATIVE
Glucose, UA: NEGATIVE
Ketones, UA: NEGATIVE
Leukocytes, UA: NEGATIVE
Nitrite, UA: NEGATIVE
Protein, UA: NEGATIVE
Spec Grav, UA: 1.025 (ref 1.010–1.025)
Urobilinogen, UA: 0.2 U/dL
pH, UA: 6 (ref 5.0–8.0)

## 2024-01-19 NOTE — Progress Notes (Signed)
 Fasting labs and UA pre physical completed [facility].

## 2024-01-20 LAB — CMP12+LP+TP+TSH+6AC+CBC/D/PLT
ALT: 26 IU/L (ref 0–44)
AST: 28 IU/L (ref 0–40)
Albumin: 4.8 g/dL (ref 4.3–5.2)
Alkaline Phosphatase: 66 IU/L (ref 44–121)
BUN/Creatinine Ratio: 16 (ref 9–20)
BUN: 12 mg/dL (ref 6–20)
Basophils Absolute: 0.1 x10E3/uL (ref 0.0–0.2)
Basos: 2 %
Bilirubin Total: <0.2 mg/dL (ref 0.0–1.2)
Calcium: 9.3 mg/dL (ref 8.7–10.2)
Chloride: 103 mmol/L (ref 96–106)
Chol/HDL Ratio: 3.6 ratio (ref 0.0–5.0)
Cholesterol, Total: 218 mg/dL — ABNORMAL HIGH (ref 100–199)
Creatinine, Ser: 0.74 mg/dL — ABNORMAL LOW (ref 0.76–1.27)
EOS (ABSOLUTE): 0.2 x10E3/uL (ref 0.0–0.4)
Eos: 3 %
Estimated CHD Risk: 0.6 times avg. (ref 0.0–1.0)
Free Thyroxine Index: 1.8 (ref 1.2–4.9)
GGT: 65 IU/L (ref 0–65)
Globulin, Total: 2.5 g/dL (ref 1.5–4.5)
Glucose: 90 mg/dL (ref 70–99)
HDL: 61 mg/dL
Hematocrit: 43.2 % (ref 37.5–51.0)
Hemoglobin: 14.2 g/dL (ref 13.0–17.7)
Immature Grans (Abs): 0 x10E3/uL (ref 0.0–0.1)
Immature Granulocytes: 0 %
Iron: 75 ug/dL (ref 38–169)
LDH: 173 IU/L (ref 121–224)
LDL Chol Calc (NIH): 128 mg/dL — ABNORMAL HIGH (ref 0–99)
Lymphocytes Absolute: 2.6 x10E3/uL (ref 0.7–3.1)
Lymphs: 42 %
MCH: 30.4 pg (ref 26.6–33.0)
MCHC: 32.9 g/dL (ref 31.5–35.7)
MCV: 93 fL (ref 79–97)
Monocytes Absolute: 0.5 x10E3/uL (ref 0.1–0.9)
Monocytes: 9 %
Neutrophils Absolute: 2.7 x10E3/uL (ref 1.4–7.0)
Neutrophils: 44 %
Phosphorus: 4.1 mg/dL (ref 2.8–4.1)
Platelets: 263 x10E3/uL (ref 150–450)
Potassium: 4.6 mmol/L (ref 3.5–5.2)
RBC: 4.67 x10E6/uL (ref 4.14–5.80)
RDW: 12.9 % (ref 11.6–15.4)
Sodium: 142 mmol/L (ref 134–144)
T3 Uptake Ratio: 33 % (ref 24–39)
T4, Total: 5.5 ug/dL (ref 4.5–12.0)
TSH: 12 u[IU]/mL — ABNORMAL HIGH (ref 0.450–4.500)
Total Protein: 7.3 g/dL (ref 6.0–8.5)
Triglycerides: 167 mg/dL — ABNORMAL HIGH (ref 0–149)
Uric Acid: 7.7 mg/dL (ref 3.8–8.4)
VLDL Cholesterol Cal: 29 mg/dL (ref 5–40)
WBC: 6.1 x10E3/uL (ref 3.4–10.8)
eGFR: 127 mL/min/1.73

## 2024-01-24 ENCOUNTER — Ambulatory Visit: Payer: Self-pay | Admitting: Physician Assistant

## 2024-01-24 ENCOUNTER — Encounter: Payer: Self-pay | Admitting: Physician Assistant

## 2024-01-24 VITALS — BP 132/94 | Temp 98.9°F | Resp 14 | Ht 69.0 in | Wt 189.0 lb

## 2024-01-24 DIAGNOSIS — Z0000 Encounter for general adult medical examination without abnormal findings: Secondary | ICD-10-CM

## 2024-01-24 NOTE — Progress Notes (Signed)
 [facility]  ____________________________________________   None    (approximate)  I have reviewed the triage vital signs and the nursing notes.   HISTORY  Chief Complaint Annual Exam  HPI Ethan Harvey is a 28 y.o. male patient presents for annual physical exam.  Patient voices no concerning complaints.         Past Medical History:  Diagnosis Date   Asthma    Depression    Scoliosis    Thyroid disease Per mother    Patient Active Problem List   Diagnosis Date Noted   Bipolar disorder, current episode depressed, severe, without psychotic features (HCC) 03/13/2015   Cocaine use disorder, mild, abuse (HCC) 03/13/2015   Cannabis use disorder, moderate, dependence (HCC) 03/13/2015   Tobacco use disorder 03/13/2015   Hypothyroidism 03/13/2015   Alcohol use disorder, mild, abuse 03/13/2015   Affective psychosis, bipolar (HCC) 03/13/2015   Major depressive disorder, recurrent severe without psychotic features (HCC)     No past surgical history on file.  Prior to Admission medications   Medication Sig Start Date End Date Taking? Authorizing Provider  oxyCODONE (OXY IR/ROXICODONE) 5 MG immediate release tablet Take by mouth. 06/29/22   [provider]    Allergies Iodinated contrast media, Penicillins, and Sulfa antibiotics  No family history on file.  Social History Social History   Tobacco Use   Smoking status: Every Day    Current packs/day: 1.00    Average packs/day: 1 pack/day for 1 year (1.0 ttl pk-yrs)    Types: Cigarettes  Substance Use Topics   Alcohol use: Yes    Comment: socially   Drug use: Yes    Types: Cocaine, Marijuana    Review of Systems Constitutional: No fever/chills Eyes: No visual changes. ENT: No sore throat. Cardiovascular: Denies chest pain. Respiratory: Denies shortness of breath. Gastrointestinal: No abdominal pain.  No nausea, no vomiting.  No diarrhea.  No  constipation. Genitourinary: Negative for dysuria. Musculoskeletal: Negative for back pain. Skin: Negative for rash. Neurological: Negative for headaches, focal weakness or numbness. Endocrine: Hypothyroidism Allergic/Immunilogical: Iodine, penicillin, and sulfa antibiotics. ____________________________________________   PHYSICAL EXAM:  VITAL SIGNS: BP 132/94  Cuff Size Normal  Temp 98.9 F (37.2 C)  Temp Source Temporal  Weight 189 lb (85.7 kg)  Height 5' 9 (1.753 m)  Resp 14  SpO2 98 %   BMI: 27.91 kg/m2  BSA: 2.04 m2   Constitutional: Alert and oriented. Well appearing and in no acute distress. Eyes: Conjunctivae are normal. PERRL. EOMI. Head: Atraumatic. Nose: No congestion/rhinnorhea. Mouth/Throat: Mucous membranes are moist.  Oropharynx non-erythematous. Neck: No stridor.  No cervical spine tenderness to palpation. Hematological/Lymphatic/Immunilogical: No cervical lymphadenopathy. Cardiovascular: Normal rate, regular rhythm. Grossly normal heart sounds.  Good peripheral circulation. Respiratory: Normal respiratory effort.  No retractions. Lungs CTAB. Gastrointestinal: Soft and nontender. No distention. No abdominal bruits. No CVA tenderness. Genitourinary: Deferred Musculoskeletal: No lower extremity tenderness nor edema.  No joint effusions. Neurologic:  Normal speech and language. No gross focal neurologic deficits are appreciated. No gait instability. Skin:  Skin is warm, dry and intact. No rash noted. Psychiatric: Mood and affect are normal. Speech and behavior are normal.  ____________________________________________   LABS          Component Ref Range & Units (hover) 5 d ago (01/19/24) 8 yr ago (03/07/15) 8 yr ago (03/07/15) 9 yr ago (08/21/14) 9 yr ago (08/21/14)  Glucose 90 77 R  78   Uric Acid 7.7  Comment:            Therapeutic target for gout patients: <6.0  BUN 12 14  19  R   Creatinine, Ser 0.74 Low  1.00 R  0.97 R   eGFR 127       BUN/Creatinine Ratio 16      Sodium 142 140 R  139 R, CM   Potassium 4.6 3.7 R  4.4 R, CM   Chloride 103 104 R  103 R   Calcium 9.3 9.7 R  9.9 R   Phosphorus 4.1      Total Protein 7.3 8.2 High  R  8.3 R   Albumin 4.8 4.9 R  5.6 High  R   Globulin, Total 2.5      Bilirubin Total <0.2 0.7 R  0.9 R   Alkaline Phosphatase 66 49 R  61 R   LDH 173      AST 28 22 R  23 R   ALT 26 15 Low  R  22 R   GGT 65      Iron 75      Cholesterol, Total 218 High       Triglycerides 167 High       HDL 61      VLDL Cholesterol Cal 29      LDL Chol Calc (NIH) 128 High       Chol/HDL Ratio 3.6      Comment:                                   T. Chol/HDL Ratio                                             Men  Women                               1/2 Avg.Risk  3.4    3.3                                   Avg.Risk  5.0    4.4                                2X Avg.Risk  9.6    7.1                                3X Avg.Risk 23.4   11.0  Estimated CHD Risk 0.6      Comment: The CHD Risk is based on the T. Chol/HDL ratio. Other factors affect CHD Risk such as hypertension, smoking, diabetes, severe obesity, and family history of premature CHD.  TSH 12.000 High       T4, Total 5.5      T3 Uptake Ratio 33      Free Thyroxine Index 1.8      WBC 6.1  8.0 R  7.8 R  RBC 4.67  4.75 R  4.74 R  Hemoglobin 14.2  14.2 R  14.5 R  Hematocrit 43.2  42.4 R  42.8 R  MCV 93  89.3 R  90.3 R  MCH 30.4  30.0 R  30.6 R  MCHC 32.9  33.6 R  33.9 R  RDW 12.9  12.7 R  12.7 R  Platelets 263  210 R  254 R  Neutrophils 44    70 R  Lymphs 42      Monocytes 9      Eos 3      Basos 2      Neutrophils Absolute 2.7    5.4 R  Lymphocytes Absolute 2.6    1.7 R  Monocytes Absolute 0.5      EOS (ABSOLUTE) 0.2      Basophils Absolute 0.1    0.1 R  Immature Granulocytes 0      Immature Grans (Abs) 0.0                      Component Ref Range & Units (hover) 5 d ago 8 yr ago 9 yr ago  Color, UA yellow    Clarity, UA  clear    Glucose, UA Negative    Bilirubin, UA neg    Ketones, UA neg    Spec Grav, UA 1.025    Blood, UA neg    pH, UA 6.0    Protein, UA Negative    Urobilinogen, UA 0.2 0.2 R 0.2 R  Nitrite, UA neg    Leukocytes, UA Negative TRACE Abnormal  R NEGATIVE R  Appearance  CLEAR R CLEAR R  Odor            ____________________________________________    ____________________________________________   INITIAL IMPRESSION / ASSESSMENT AND PLAN   As part of my medical decision making, I reviewed the following data within the electronic MEDICAL RECORD NUMBER       No acute findings on physical exam.  Labs reveal TSH.  Patient will follow-up with PCP for additional evaluation and treatment.     ____________________________________________   FINAL CLINICAL IMPRESSION Normal physical exam with elevated TSH.   ED Discharge Orders     None        Note:  This document was prepared using Dragon voice recognition software and may include unintentional dictation errors.

## 2024-01-24 NOTE — Progress Notes (Signed)
 Pt presents today to complete physical, Pt did not voice any concerns at this time. Frann Ivans
